# Patient Record
Sex: Male | Born: 1970 | Race: White | Hispanic: No | Marital: Single | Smoking: Never smoker
Health system: Southern US, Community
[De-identification: ages and names within clinical notes are randomized; demographics above are authoritative.]

---

## 2002-08-28 ENCOUNTER — Encounter: Payer: Self-pay | Admitting: *Deleted

## 2002-08-28 ENCOUNTER — Encounter: Admission: RE | Admit: 2002-08-28 | Discharge: 2002-08-28 | Payer: Self-pay | Admitting: *Deleted

## 2002-09-04 ENCOUNTER — Ambulatory Visit (HOSPITAL_BASED_OUTPATIENT_CLINIC_OR_DEPARTMENT_OTHER): Admission: RE | Admit: 2002-09-04 | Discharge: 2002-09-04 | Payer: Self-pay | Admitting: Urology

## 2003-01-14 ENCOUNTER — Encounter (HOSPITAL_COMMUNITY): Admission: RE | Admit: 2003-01-14 | Discharge: 2003-04-14 | Payer: Self-pay | Admitting: *Deleted

## 2005-03-13 IMAGING — NM NM THYROID IMAGING W/ UPTAKE SINGLE (24 HR)
4 series · 4 of 4 positions shown · non-contrast
Comparison: none

CLINICAL DATA: Thyroid enlargement.  
 NUCLEAR MEDICINE THYROID IMAGING WITH UPTAKE 
 Following oral ingestion of 13.6 uCi of iodine-222, a 24-hour radioactive iodine uptake of 12.3% was obtained (normal 10-35%).    Following the IV injection of 10.0 mCi of pertechnetate, images were obtained which showed an inhomogeneous gland with an appearance most consistent with a multinodular gland.  When correlating the appearance with the report from [REDACTED] on 08/28/02, and from the patient?s thyroid ultrasound, the findings would correspond to the appearance on the ultrasound study.

[Series 1: th thyroid scan · 1.07mm/px · 1 of 1 slices shown (1 of 4)]
[im 1/1  full-range]
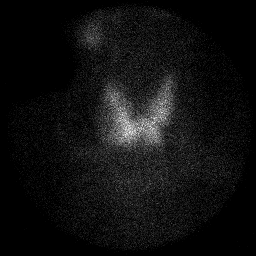

[Series 1: th thyroid scan · 1.07mm/px · 1 of 1 slices shown (2 of 4)]
[im 1/1  full-range]
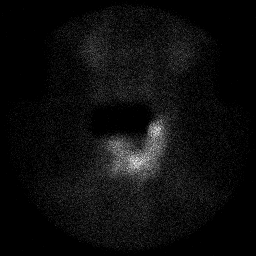

[Series 1: th thyroid scan · 1.07mm/px · 1 of 1 slices shown (3 of 4)]
[im 1/1  full-range]
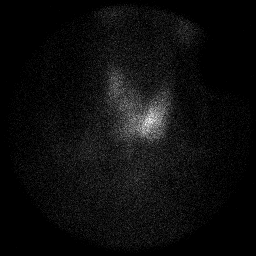

[Series 1: th thyroid scan · 1.07mm/px · 1 of 1 slices shown (4 of 4)]
[im 1/1  full-range]
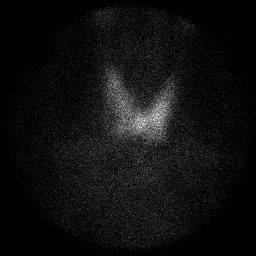

[4 of 4 positions shown; findings below may reference images not displayed]

IMPRESSION: Low-normal 24-hour radioactive iodine uptake at 12.3%.  The findings are most consistent with a multinodular goiter, as discussed above.

## 2010-01-31 ENCOUNTER — Encounter: Payer: Self-pay | Admitting: *Deleted

## 2021-10-11 ENCOUNTER — Other Ambulatory Visit: Payer: Self-pay

## 2021-10-11 ENCOUNTER — Emergency Department (HOSPITAL_COMMUNITY)
Admission: EM | Admit: 2021-10-11 | Discharge: 2021-10-12 | Disposition: A | Discharge status: Home / Self Care | Payer: Medicaid - Out of State | Attending: Emergency Medicine | Admitting: Emergency Medicine

## 2021-10-11 DIAGNOSIS — M795 Residual foreign body in soft tissue: Secondary | ICD-10-CM | POA: Diagnosis present

## 2021-10-11 MED ORDER — LIDOCAINE HCL (PF) 1 % IJ SOLN
5.0000 mL | Freq: Once | INTRAMUSCULAR | Status: AC
  Administered 2021-10-12: 5 mL
  Filled 2021-10-11: qty 30

## 2021-10-11 NOTE — ED Provider Notes (Signed)
[facility] DEPT Provider Note   CSN: [id number] Arrival date & time: 10/11/21  2021     History  Chief Complaint  Patient presents with   Foreign Body in Anthony Potts is a 51 y.o. male who presents to the emergency department with concerns for foreign body in skin onset today.  Patient notes that he was running today when he ran into a branch.  He has that he presents to be a splinter in his left shin.  Denies trying to remove at home.  Patient notes that he used alcohol as well as wet wipes to clean the area.  Denies fever, redness, swelling, drainage.  Denies allergies to medications.  The history is provided by the patient. No language interpreter was used.       Home Medications Prior to Admission medications   Not on File      Allergies    Patient has no allergy information on record.    Review of Systems   Review of Systems  Constitutional:  Negative for fever.  Skin:  Negative for color change and wound.  All other systems reviewed and are negative.   Physical Exam Updated Vital Signs BP (!) 139/97   Pulse 65   Temp 98.4 F (36.9 C) (Oral)   Resp 18   Ht 5\' 10"  (1.778 m)   Wt 127 kg   SpO2 94%   BMI 40.18 kg/m  Physical Exam Vitals and nursing note reviewed.  Constitutional:      General: He is not in acute distress.    Appearance: Normal appearance.  Eyes:     General: No scleral icterus.    Extraocular Movements: Extraocular movements intact.  Cardiovascular:     Rate and Rhythm: Normal rate.  Pulmonary:     Effort: Pulmonary effort is normal. No respiratory distress.  Abdominal:     Palpations: Abdomen is soft. There is no mass.     Tenderness: There is no abdominal tenderness.  Musculoskeletal:        General: Normal range of motion.     Cervical back: Neck supple.     Comments: End piece of splinter noted laterally and distal to the left knee.  Mild TTP noted to the left knee. No TTP noted to  left shin. No drainage noted. No erythema noted.  Decreased flexion of left knee secondary to pain.  Normal extension of left knee.  Skin:    General: Skin is warm and dry.     Findings: No rash.  Neurological:     Mental Status: He is alert.     Sensory: Sensation is intact.     Motor: Motor function is intact.  Psychiatric:        Behavior: Behavior normal.     ED Results / Procedures / Treatments   Labs (all labs ordered are listed, but only abnormal results are displayed) Labs Reviewed - No data to display  EKG None  Radiology No results found.  Procedures Ultrasound ED Soft Tissue  Date/Time: 10/11/2021 10:49 PM  Performed by: Steva Colder A, PA-C Authorized by: Nehemiah Settle, PA-C   Procedure details:    Indications: evaluate for foreign body     Transverse view:  Visualized   Longitudinal view:  Visualized   Images: not archived   Location:    Location: lower extremity     Side:  Left Findings:     foreign body present .Foreign Body Removal  Date/Time: 10/12/2021 10:50 AM  Performed by: Karenann Cai, PA-C Authorized by: Karenann Cai, PA-C  Consent: Verbal consent obtained. Risks and benefits: risks, benefits and alternatives were discussed Consent given by: patient Patient understanding: patient states understanding of the procedure being performed Patient identity confirmed: verbally with patient and hospital-assigned identification number Time out: Immediately prior to procedure a "time out" was called to verify the correct patient, procedure, equipment, support staff and site/side marked as required. Body area: skin General location: lower extremity Location details: left knee Anesthesia: local infiltration  Anesthesia: Local Anesthetic: lidocaine 1% without epinephrine Anesthetic total: 5 mL  Sedation: Patient sedated: no  Complexity: simple 1 objects recovered. Objects recovered: wood Post-procedure assessment: foreign body  removed Patient tolerance: patient tolerated the procedure well with no immediate complications      Medications Ordered in ED Medications - No data to display  ED Course/ Medical Decision Making/ A&P                           Medical Decision Making Risk Prescription drug management.   Pt presents with concerns for foreign body to the left knee onset prior to arrival. Vital signs, pt afebrile. On exam, pt with End piece of splinter noted laterally and distal to the left knee. No TTP noted to the left knee. No TTP noted to left shin. No drainage noted. No erythema noted.   Medications:  I ordered medication including Keflex for first dose of antibiotic prophylaxis I have reviewed the patients home medicines and have made adjustments as needed   Disposition: Patient states suspicious for foreign body in the left knee.  Doubt cellulitis or abscess at this time. After consideration of the diagnostic results and the patients response to treatment, I feel that the patient would benefit from Discharge home.  We will treat patient with first dose of Keflex here in the emergency department.  Prescription sent for Keflex. Attending evaluated patient and removed 1 piece of wood, see his procedure note for additional details. Supportive care measures and strict return precautions discussed with patient at bedside. Pt acknowledges and verbalizes understanding. Pt appears safe for discharge. Follow up as indicated in discharge paperwork.    This chart was dictated using voice recognition software, Dragon. Despite the best efforts of this provider to proofread and correct errors, errors may still occur which can change documentation meaning.  Final Clinical Impression(s) / ED Diagnoses Final diagnoses:  Foreign body (FB) in soft tissue    Rx / DC Orders ED Discharge Orders          Ordered    cephALEXin (KEFLEX) 500 MG capsule  4 times daily        10/12/21 0149              Isel Skufca,  Woods Gangemi A, PA-C 10/13/21 0349    Glynn Octave, MD 10/13/21 [phone]

## 2021-10-11 NOTE — Discharge Instructions (Addendum)
It was a pleasure taking care of you today!  You will be sent a prescription for keflex, take as prescribed. Ensure to keep the area clean and dry. You may clean the area with soap and water if it becomes dirty. Follow up with your primary care provider as needed. Return to the ED if you are experiencing increasing/worsening symptoms such as drainage, inability to move the knee, redness spreading, or fever.

## 2021-10-11 NOTE — ED Triage Notes (Signed)
Pt states that he was running today and fell, getting some wood stuck under his skin (left knee). Pt is ambulatory.

## 2021-10-11 NOTE — ED Provider Triage Note (Signed)
Emergency Medicine Provider Triage Evaluation Note  Anthony Potts , a 51 y.o. male  was evaluated in triage.  Pt complains of foreign body in the left leg. Was running and ran into a branch. Complaining of what he thinks is a 2 inch splinter in his left shin.  Review of Systems  Positive: Foreign body in leg Negative:   Physical Exam  BP (!) 181/95 (BP Location: Left Arm)   Pulse 66   Temp 98.4 F (36.9 C) (Oral)   Resp 18   Ht 5\' 10"  (1.778 m)   Wt 127 kg   SpO2 100%   BMI 40.18 kg/m  Gen:   Awake, no distress   Resp:  Normal effort  MSK:   Moves extremities without difficulty  Other:  Can visualize end piece just lateral/distal to the inner left knee  Medical Decision Making  Medically screening exam initiated at 8:41 PM.  Appropriate orders placed.  Anthony Potts was informed that the remainder of the evaluation will be completed by another provider, this initial triage assessment does not replace that evaluation, and the importance of remaining in the ED until their evaluation is complete.     Kateri Plummer, Hershal Coria 10/11/21 2042

## 2021-10-12 MED ORDER — BACITRACIN ZINC 500 UNIT/GM EX OINT
TOPICAL_OINTMENT | Freq: Once | CUTANEOUS | Status: DC
  Filled 2021-10-12: qty 0.9

## 2021-10-12 MED ORDER — CEPHALEXIN 500 MG PO CAPS: 500.0000 mg | ORAL_CAPSULE | Freq: Four times a day (QID) | ORAL | 0 refills | Status: AC

## 2021-10-12 MED ORDER — CEPHALEXIN 500 MG PO CAPS
500.0000 mg | ORAL_CAPSULE | Freq: Once | ORAL | Status: AC
  Administered 2021-10-12: 500 mg via ORAL
  Filled 2021-10-12: qty 1

## 2022-12-03 ENCOUNTER — Ambulatory Visit: Payer: BC Managed Care – PPO | Admitting: Family Medicine

## 2022-12-03 ENCOUNTER — Encounter: Payer: Self-pay | Admitting: Family Medicine

## 2022-12-03 VITALS — BP 152/86 | HR 90 | Temp 97.6°F | Ht 70.5 in | Wt 311.6 lb

## 2022-12-03 DIAGNOSIS — E66813 Obesity, class 3: Secondary | ICD-10-CM

## 2022-12-03 DIAGNOSIS — R030 Elevated blood-pressure reading, without diagnosis of hypertension: Secondary | ICD-10-CM

## 2022-12-03 DIAGNOSIS — G8929 Other chronic pain: Secondary | ICD-10-CM | POA: Insufficient documentation

## 2022-12-03 DIAGNOSIS — M25562 Pain in left knee: Secondary | ICD-10-CM | POA: Diagnosis not present

## 2022-12-03 DIAGNOSIS — K760 Fatty (change of) liver, not elsewhere classified: Secondary | ICD-10-CM

## 2022-12-03 DIAGNOSIS — Z6841 Body Mass Index (BMI) 40.0 and over, adult: Secondary | ICD-10-CM

## 2022-12-03 DIAGNOSIS — E785 Hyperlipidemia, unspecified: Secondary | ICD-10-CM | POA: Insufficient documentation

## 2022-12-03 DIAGNOSIS — E039 Hypothyroidism, unspecified: Secondary | ICD-10-CM | POA: Insufficient documentation

## 2022-12-03 DIAGNOSIS — R140 Abdominal distension (gaseous): Secondary | ICD-10-CM | POA: Diagnosis not present

## 2022-12-03 NOTE — Progress Notes (Incomplete)
Assessment  Assessment/Plan:   Problem List Items Addressed This Visit   None   There are no discontinued medications.  Patient Counseling(The following topics were reviewed and/or handout was given):  -Nutrition: Stressed importance of moderation in sodium/caffeine intake, saturated fat and cholesterol, caloric balance, sufficient intake of fresh fruits, vegetables, and fiber.  -Stressed the importance of regular exercise.   -Substance Abuse: Discussed cessation/primary prevention of tobacco, alcohol, or other drug use; driving or other dangerous activities under the influence; availability of treatment for abuse.   -Injury prevention: Discussed safety belts, safety helmets, smoke detector, smoking near bedding or upholstery.   -Sexuality: Discussed sexually transmitted diseases, partner selection, use of condoms, avoidance of unintended pregnancy and contraceptive alternatives.   -Dental health: Discussed importance of regular tooth brushing, flossing, and dental visits.  -Health maintenance and immunizations reviewed. Please refer to Health maintenance section.  Return to care in 1 year for next preventative visit.        Subjective:  Chief complaint Encounter date: 12/03/2022  Chief Complaint  Patient presents with  . Establish Care    Wants CPE if possible. Referral for chest hernia and torn MCL or acl left knee. Declined colonoscopy referral , but would like to discuss for future visit.   Anthony Potts is a 52 y.o. male who presents today for his annual comprehensive physical exam.     Lifestyle Diet: *** Exercise: ***    ROS     12/03/2022    3:54 PM  Depression screen PHQ 2/9  Decreased Interest 0  Down, Depressed, Hopeless 0  PHQ - 2 Score 0  Altered sleeping 0  Tired, decreased energy 0  Change in appetite 0  Feeling bad or failure about yourself  0  Trouble concentrating 0  Moving slowly or fidgety/restless 0  Suicidal thoughts 0  PHQ-9 Score  0  Difficult doing work/chores Not difficult at all       12/03/2022    3:54 PM  GAD 7 : Generalized Anxiety Score  Nervous, Anxious, on Edge 0  Control/stop worrying 0  Worry too much - different things 0  Trouble relaxing 0  Restless 0  Easily annoyed or irritable 0  Afraid - awful might happen 0  Total GAD 7 Score 0  Anxiety Difficulty Not difficult at all    Health Maintenance Due  Topic Date Due  . HIV Screening  Never done  . Hepatitis C Screening  Never done  . Colonoscopy  Never done     Dental: ***UTD Vision: ***UTD  PMH:  The following were reviewed and entered/updated in epic: History reviewed. No pertinent past medical history.  There are no problems to display for this patient.   History reviewed. No pertinent surgical history.  History reviewed. No pertinent family history.  Medications- reviewed and updated No outpatient medications prior to visit.   No facility-administered medications prior to visit.    No Known Allergies  Social History   Socioeconomic History  . Marital status: Single    Spouse name: Not on file  . Number of children: Not on file  . Years of education: Not on file  . Highest education level: Not on file  Occupational History  . Not on file  Tobacco Use  . Smoking status: Never    Passive exposure: Never  . Smokeless tobacco: Never  Vaping Use  . Vaping status: Never Used  Substance and Sexual Activity  . Alcohol use: Yes    Alcohol/week:  1.0 standard drink of alcohol    Types: 1 Cans of beer per week  . Drug use: Never  . Sexual activity: Not on file  Other Topics Concern  . Not on file  Social History Narrative  . Not on file   Social Determinants of Health   Financial Resource Strain: Not on file  Food Insecurity: Not on file  Transportation Needs: Not on file  Physical Activity: Not on file  Stress: Not on file (11/15/2022)  Social Connections: Not on file           Objective:  Physical  Exam: BP (!) 152/86 (BP Location: Left Arm, Patient Position: Sitting, Cuff Size: Large)   Pulse 90   Temp 97.6 F (36.4 C) (Temporal)   Ht 5' 10.5" (1.791 m)   Wt (!) 311 lb 9.6 oz (141.3 kg)   SpO2 99%   BMI 44.08 kg/m   Body mass index is 44.08 kg/m. Wt Readings from Last 3 Encounters:  12/03/22 (!) 311 lb 9.6 oz (141.3 kg)  10/11/21 280 lb (127 kg)    Physical Exam      At today's visit, we discussed treatment options, associated risk and benefits, and engage in counseling as needed.  Additionally the following were reviewed: Past medical records, past medical and surgical history, family and social background, as well as relevant laboratory results, imaging findings, and specialty notes, where applicable.  This message was generated using dictation software, and as a result, it may contain unintentional typos or errors.  Nevertheless, extensive effort was made to accurately convey at the pertinent aspects of the patient visit.    There may have been are other unrelated non-urgent complaints, but due to the busy schedule and the amount of time already spent with him, time does not permit to address these issues at today's visit. Another appointment may have or has been requested to review these additional issues.     Thomes Dinning, MD, MS

## 2022-12-04 NOTE — Assessment & Plan Note (Signed)
Discomfort since hyperextension injury 2 years ago.  Plan: Refer to orthopedics. Continue conservative measures. Return if symptoms worsen.

## 2022-12-04 NOTE — Assessment & Plan Note (Signed)
Off levothyroxine 112 mcg daily for years. Plan: At next visit Order TSH and Free T4 tests. Restart medication based on labs.

## 2022-12-04 NOTE — Progress Notes (Signed)
Assessment/Plan:   Problem List Items Addressed This Visit       Digestive   Metabolic dysfunction-associated steatotic liver disease (MASLD)    History of elevated AST/ALT (AST 52 U/L, ALT 84 U/L in 2011).  Plan: At next visit order liver function tests and abdominal ultrasound Promote weight loss and diet changes. Follow-Up: Review labs at next appointment; periodic monitoring.      Relevant Orders   US Abdomen Complete     Endocrine   Acquired hypothyroidism    Off levothyroxine 112 mcg daily for years. Plan: At next visit Order TSH and Free T4 tests. Restart medication based on labs.        Other   Hyperlipidemia    History of elevated LDL (113 mg/dL in 9323). Plan: Order lipid panel at next follow up Treat pending results      Elevated blood pressure reading in office without diagnosis of hypertension    BP 152/86 mmHg; no prior hypertension.  Plan: Advise home BP monitoring. Encourage reduced sodium intake and exercise. Follow-Up: Re-evaluate BP in 2 weeks; educate on warning signs.      Class 3 severe obesity due to excess calories with body mass index (BMI) of 40.0 to 44.9 in adult [facility])    Counsel on diet and exercise. Consider Referral to nutritionist. Follow-Up: Reassess in 2 weeks; monitor weight and BMI.      Chronic pain of left knee    Discomfort since hyperextension injury 2 years ago.  Plan: Refer to orthopedics. Continue conservative measures. Return if symptoms worsen.      Relevant Orders   Ambulatory referral to Orthopedic Surgery   Abdominal distention - Primary   Relevant Orders   US Abdomen Complete    There are no discontinued medications.  Return for physical (fasting labs).    Subjective:   Encounter date: 12/03/2022  Anthony Potts is a 52 y.o. male who has Acquired hypothyroidism; Hyperlipidemia; Elevated blood pressure reading in office without diagnosis of hypertension; Class 3 severe obesity due to excess  calories with body mass index (BMI) of 40.0 to 44.9 in adult [facility]); Chronic pain of left knee; Abdominal distention; and Metabolic dysfunction-associated steatotic liver disease (MASLD) on their problem list..   He  has no past medical history on file..   Chief Complaint: Establishing care; evaluation of chronic conditions.  History of Present Illness:  52 year old patient returns to care after years without medical supervision due to lack of insurance. Reports obesity (BMI 44), elevated BP today (152/86 mmHg), past hyperlipidemia (LDL 113 mg/dL in 5573), hypothyroidism (stopped levothyroxine 112 mcg daily years ago), and NAFLD (elevated AST/ALT in 2011).  Expresses concern about a reducible, non-tender epigastric bulge noticeable when bending or straining.   Reports left knee discomfort since a hyperextension injury during a run two years prior; manages pain conservatively.  Denies associated symptoms such as chest pain, dyspnea, gastrointestinal bleeding, neurological deficits, or signs of infection.  Review of Systems:  Positive: Epigastric bulge, left knee discomfort. Negative: Remainder of systems reviewed and non-contributory.    History reviewed. No pertinent surgical history.  No outpatient medications prior to visit.   No facility-administered medications prior to visit.    History reviewed. No pertinent family history.  Social History   Socioeconomic History   Marital status: Single    Spouse name: Not on file   Number of children: Not on file   Years of education: Not on file   Highest education level: Not on  file  Occupational History   Not on file  Tobacco Use   Smoking status: Never    Passive exposure: Never   Smokeless tobacco: Never  Vaping Use   Vaping status: Never Used  Substance and Sexual Activity   Alcohol use: Yes    Alcohol/week: 1.0 standard drink of alcohol    Types: 1 Cans of beer per week   Drug use: Never   Sexual activity: Not on  file  Other Topics Concern   Not on file  Social History Narrative   Not on file   Social Determinants of Health   Financial Resource Strain: Not on file  Food Insecurity: Not on file  Transportation Needs: Not on file  Physical Activity: Not on file  Stress: Not on file (11/15/2022)  Social Connections: Not on file  Intimate Partner Violence: Not on file                                                                                                  Objective:  Physical Exam: BP (!) 152/86 (BP Location: Left Arm, Patient Position: Sitting, Cuff Size: Large)   Pulse 90   Temp 97.6 F (36.4 C) (Temporal)   Ht 5' 10.5" (1.791 m)   Wt (!) 311 lb 9.6 oz (141.3 kg)   SpO2 99%   BMI 44.08 kg/m     Physical Exam Constitutional:      Appearance: Normal appearance.  HENT:     Head: Normocephalic and atraumatic.     Right Ear: Hearing normal.     Left Ear: Hearing normal.     Nose: Nose normal.  Eyes:     General: No scleral icterus.       Right eye: No discharge.        Left eye: No discharge.     Extraocular Movements: Extraocular movements intact.  Cardiovascular:     Rate and Rhythm: Normal rate and regular rhythm.     Heart sounds: Normal heart sounds.  Pulmonary:     Effort: Pulmonary effort is normal.     Breath sounds: Normal breath sounds.  Abdominal:     General: Abdomen is flat. Bowel sounds are normal. There is distension (easily reducable).     Palpations: Abdomen is soft. There is no mass.     Tenderness: There is no abdominal tenderness.    Musculoskeletal:     Right knee: Normal.     Left knee: No swelling, deformity, effusion or erythema. Normal range of motion. No tenderness. Normal alignment, normal meniscus and normal patellar mobility.  Skin:    General: Skin is warm.     Findings: No rash.  Neurological:     General: No focal deficit present.     Mental Status: He is alert.     Cranial Nerves: No cranial nerve deficit.  Psychiatric:         Mood and Affect: Mood normal.        Behavior: Behavior normal.        Thought Content: Thought content normal.        Judgment:  Judgment normal.     No results found.  No results found for this or any previous visit (from the past 2160 hour(s)).      Garner Nash, MD, MS

## 2022-12-04 NOTE — Assessment & Plan Note (Signed)
History of elevated LDL (113 mg/dL in 1610). Plan: Order lipid panel at next follow up Treat pending results

## 2022-12-04 NOTE — Assessment & Plan Note (Signed)
BP 152/86 mmHg; no prior hypertension.  Plan: Advise home BP monitoring. Encourage reduced sodium intake and exercise. Follow-Up: Re-evaluate BP in 2 weeks; educate on warning signs.

## 2022-12-04 NOTE — Assessment & Plan Note (Signed)
History of elevated AST/ALT (AST 52 U/L, ALT 84 U/L in 2011).  Plan: At next visit order liver function tests and abdominal ultrasound Promote weight loss and diet changes. Follow-Up: Review labs at next appointment; periodic monitoring.

## 2022-12-04 NOTE — Assessment & Plan Note (Signed)
Counsel on diet and exercise. Consider Referral to nutritionist. Follow-Up: Reassess in 2 weeks; monitor weight and BMI.

## 2022-12-06 ENCOUNTER — Telehealth: Payer: Self-pay | Admitting: Family Medicine

## 2022-12-06 NOTE — Telephone Encounter (Signed)
Left patient a detailed voice message with Ortho care info and advised patient that their information was sent this morning by Verdis Frederickson via [place].    [id number] - AMB REFERRAL TO ORTHOPEDIC SURGERY Authorization #:     Referring Provider Information Garnette Gunner [facility] [phone] Referring To Provider Information [facility] [address] [place] [address] [phone]   Referral Start Date: 12/04/2022 Referral End Date: 12/04/2023

## 2022-12-06 NOTE — Telephone Encounter (Signed)
Please give patient a call. Patient really want to know when and where do he go to the ortho dr. I really think he trying to get in there early like asap

## 2022-12-13 ENCOUNTER — Ambulatory Visit (HOSPITAL_BASED_OUTPATIENT_CLINIC_OR_DEPARTMENT_OTHER)
Admission: RE | Admit: 2022-12-13 | Discharge: 2022-12-13 | Disposition: A | Discharge status: Home / Self Care | Payer: BC Managed Care – PPO | Source: Ambulatory Visit | Attending: Family Medicine | Admitting: Family Medicine

## 2022-12-13 DIAGNOSIS — R140 Abdominal distension (gaseous): Secondary | ICD-10-CM | POA: Diagnosis not present

## 2022-12-13 DIAGNOSIS — K760 Fatty (change of) liver, not elsewhere classified: Secondary | ICD-10-CM | POA: Diagnosis not present

## 2022-12-13 DIAGNOSIS — R939 Diagnostic imaging inconclusive due to excess body fat of patient: Secondary | ICD-10-CM | POA: Diagnosis not present

## 2022-12-13 NOTE — Addendum Note (Signed)
Addended by: Fanny Bien B on: 12/13/2022 12:48 PM   Modules accepted: Orders

## 2022-12-14 ENCOUNTER — Encounter: Payer: Self-pay | Admitting: Family Medicine

## 2022-12-14 ENCOUNTER — Other Ambulatory Visit (INDEPENDENT_AMBULATORY_CARE_PROVIDER_SITE_OTHER): Payer: Self-pay

## 2022-12-14 ENCOUNTER — Ambulatory Visit: Payer: BC Managed Care – PPO | Admitting: Physician Assistant

## 2022-12-14 ENCOUNTER — Encounter: Payer: Self-pay | Admitting: Physician Assistant

## 2022-12-14 ENCOUNTER — Ambulatory Visit (INDEPENDENT_AMBULATORY_CARE_PROVIDER_SITE_OTHER): Payer: BC Managed Care – PPO | Admitting: Family Medicine

## 2022-12-14 VITALS — BP 140/80 | HR 76 | Temp 97.6°F | Ht 70.5 in | Wt 308.4 lb

## 2022-12-14 DIAGNOSIS — G8929 Other chronic pain: Secondary | ICD-10-CM

## 2022-12-14 DIAGNOSIS — Z125 Encounter for screening for malignant neoplasm of prostate: Secondary | ICD-10-CM

## 2022-12-14 DIAGNOSIS — R140 Abdominal distension (gaseous): Secondary | ICD-10-CM

## 2022-12-14 DIAGNOSIS — I10 Essential (primary) hypertension: Secondary | ICD-10-CM

## 2022-12-14 DIAGNOSIS — Z1211 Encounter for screening for malignant neoplasm of colon: Secondary | ICD-10-CM

## 2022-12-14 DIAGNOSIS — R0683 Snoring: Secondary | ICD-10-CM | POA: Insufficient documentation

## 2022-12-14 DIAGNOSIS — M25562 Pain in left knee: Secondary | ICD-10-CM

## 2022-12-14 DIAGNOSIS — K760 Fatty (change of) liver, not elsewhere classified: Secondary | ICD-10-CM | POA: Diagnosis not present

## 2022-12-14 DIAGNOSIS — Z0000 Encounter for general adult medical examination without abnormal findings: Secondary | ICD-10-CM

## 2022-12-14 DIAGNOSIS — R351 Nocturia: Secondary | ICD-10-CM | POA: Diagnosis not present

## 2022-12-14 DIAGNOSIS — E039 Hypothyroidism, unspecified: Secondary | ICD-10-CM | POA: Diagnosis not present

## 2022-12-14 DIAGNOSIS — E66813 Obesity, class 3: Secondary | ICD-10-CM

## 2022-12-14 DIAGNOSIS — Z6841 Body Mass Index (BMI) 40.0 and over, adult: Secondary | ICD-10-CM

## 2022-12-14 DIAGNOSIS — E782 Mixed hyperlipidemia: Secondary | ICD-10-CM

## 2022-12-14 HISTORY — DX: Essential (primary) hypertension: I10

## 2022-12-14 LAB — CBC WITH DIFFERENTIAL/PLATELET
Basophils Absolute: 0.1 10*3/uL (ref 0.0–0.1)
Basophils Relative: 1.1 % (ref 0.0–3.0)
Eosinophils Absolute: 0.2 10*3/uL (ref 0.0–0.7)
Eosinophils Relative: 3.5 % (ref 0.0–5.0)
HCT: 49.4 % (ref 39.0–52.0)
Hemoglobin: 16.7 g/dL (ref 13.0–17.0)
Lymphocytes Relative: 40.4 % (ref 12.0–46.0)
Lymphs Abs: 2.4 10*3/uL (ref 0.7–4.0)
MCHC: 33.7 g/dL (ref 30.0–36.0)
MCV: 94.2 fL (ref 78.0–100.0)
Monocytes Absolute: 0.5 10*3/uL (ref 0.1–1.0)
Monocytes Relative: 8.7 % (ref 3.0–12.0)
Neutro Abs: 2.7 10*3/uL (ref 1.4–7.7)
Neutrophils Relative %: 46.3 % (ref 43.0–77.0)
Platelets: 185 10*3/uL (ref 150.0–400.0)
RBC: 5.24 Mil/uL (ref 4.22–5.81)
RDW: 13.8 % (ref 11.5–15.5)
WBC: 5.9 10*3/uL (ref 4.0–10.5)

## 2022-12-14 LAB — COMPREHENSIVE METABOLIC PANEL
ALT: 47 U/L (ref 0–53)
AST: 35 U/L (ref 0–37)
Albumin: 4 g/dL (ref 3.5–5.2)
Alkaline Phosphatase: 61 U/L (ref 39–117)
BUN: 17 mg/dL (ref 6–23)
CO2: 30 meq/L (ref 19–32)
Calcium: 8.9 mg/dL (ref 8.4–10.5)
Chloride: 106 meq/L (ref 96–112)
Creatinine, Ser: 0.81 mg/dL (ref 0.40–1.50)
GFR: 103.02 mL/min (ref >60.00)
Glucose, Bld: 97 mg/dL (ref 70–99)
Potassium: 4.4 meq/L (ref 3.5–5.1)
Sodium: 140 meq/L (ref 135–145)
Total Bilirubin: 0.5 mg/dL (ref 0.2–1.2)
Total Protein: 6.9 g/dL (ref 6.0–8.3)

## 2022-12-14 LAB — LIPID PANEL
Cholesterol: 195 mg/dL (ref 0–200)
HDL: 36.9 mg/dL — ABNORMAL LOW (ref >39.00)
LDL Cholesterol: 133 mg/dL — ABNORMAL HIGH (ref 0–99)
NonHDL: 157.74
Total CHOL/HDL Ratio: 5
Triglycerides: 125 mg/dL (ref 0.0–149.0)
VLDL: 25 mg/dL (ref 0.0–40.0)

## 2022-12-14 LAB — TSH: TSH: 11.17 u[IU]/mL — ABNORMAL HIGH (ref 0.35–5.50)

## 2022-12-14 LAB — HEMOGLOBIN A1C: Hgb A1c MFr Bld: 5.9 % (ref 4.6–6.5)

## 2022-12-14 LAB — PSA: PSA: 1.39 ng/mL (ref 0.10–4.00)

## 2022-12-14 MED ORDER — BLOOD PRESSURE CUFF MISC: 1.0000 | 0 refills | Status: DC | PRN

## 2022-12-14 NOTE — Progress Notes (Signed)
Office Visit Note   Patient: Anthony Potts           Date of Birth: 1970-06-20           MRN: [id number] Visit Date: 12/14/2022              Requested by: Garnette Gunner, MD [address] [place],  [place] [address] PCP: Garnette Gunner, MD   Assessment & Plan: Visit Diagnoses: Medial meniscus tear left knee  Plan: Patient is an avid distance runner.  He comes in today after hyperextending his left knee 6 months ago on a race.  He has been running and competing since he was in high school.  Following this he did modify his activity did not run for a month.  Pain continues to be consistent despite activity modification.  He does have some instability occasionally when he turns a certain way.  Never had problem with the knee before.  It has been now 6 months with activity modification.  His ligamentous structures appear to be solid however I am concerned for medial meniscus tear.  He has had a regular x-ray today that does not demonstrate any osseous reasons for his pain.  Recommended an MRI based on that we will decide follow-up  Follow-Up Instructions: No follow-ups on file.   Orders:  No orders of the defined types were placed in this encounter.  No orders of the defined types were placed in this encounter.     Procedures: No procedures performed   Clinical Data: No additional findings.   Subjective: Chief Complaint  Patient presents with   Left Knee - Pain    HPI patient is a pleasant 52 year old gentleman with a 5-month history of left medial knee pain.  This occurred after a hyperextension injury when he was running down a hill on a 24-hour race in May.  He cannot run now secondary to the pain no swelling or popping he does get instability has a history of an foreign object impaled into this knee that was removed in the ER in the past rates his pain is moderate and limiting  Review of Systems  All other systems reviewed and are  negative.    Objective: Vital Signs: There were no vitals taken for this visit.  Physical Exam Constitutional:      Appearance: Normal appearance.  Pulmonary:     Effort: Pulmonary effort is normal.  Skin:    General: Skin is warm and dry.  Neurological:     General: No focal deficit present.     Mental Status: He is alert.  Psychiatric:        Mood and Affect: Mood normal.        Behavior: Behavior normal.     Ortho Exam Left knee no effusion no erythema compartments are soft and compressible he has excellent strength good quadricep patellar strength.  He has good endpoint on anterior draw good varus valgus stability.  He is acutely tender over the medial posterior joint line tender.  Nontender over the lateral joint line Specialty Comments:  No specialty comments available.  Imaging: US Abdomen Complete  Result Date: 12/13/2022 CLINICAL DATA:  Abdominal distention EXAM: ABDOMEN ULTRASOUND COMPLETE COMPARISON:  None Available. FINDINGS: Gallbladder: No gallstones or wall thickening visualized. No sonographic Murphy sign noted by sonographer. Common bile duct: Diameter: 3 mm Liver: Increased echogenicity. No focal lesion. Portal vein is patent on color Doppler imaging with normal direction of blood flow towards the liver.  IVC: No abnormality visualized. Pancreas: Visualized portion unremarkable. Spleen: Size and appearance within normal limits. Right Kidney: Length: 13.6 cm. Echogenicity within normal limits. No mass or hydronephrosis visualized. Left Kidney: Length: 13.2 cm. Echogenicity within normal limits. No mass or hydronephrosis visualized. Abdominal aorta: No aneurysm visualized. Other findings: At the site of midline abdominal bulge no definite mass or hernia identified however examination is limited secondary to body habitus. IMPRESSION: 1. Increased hepatic parenchymal echogenicity suggestive of steatosis. 2. No cholelithiasis or sonographic evidence for acute cholecystitis.  3. At the site of midline abdominal bulge no definite mass or hernia identified however examination is limited secondary to body habitus. Electronically Signed   By: Annia Belt M.D.   On: 12/13/2022 09:48     PMFS History: Patient Active Problem List   Diagnosis Date Noted   Acquired hypothyroidism 12/03/2022   Hyperlipidemia 12/03/2022   Elevated blood pressure reading in office without diagnosis of hypertension 12/03/2022   Class 3 severe obesity due to excess calories with body mass index (BMI) of 40.0 to 44.9 in adult [facility]) 12/03/2022   Chronic pain of left knee 12/03/2022   Abdominal distention 12/03/2022   Metabolic dysfunction-associated steatotic liver disease (MASLD) 12/03/2022   History reviewed. No pertinent past medical history.  Family History  Problem Relation Age of Onset   Diabetes Mother    Cancer Sister     History reviewed. No pertinent surgical history. Social History   Occupational History   Not on file  Tobacco Use   Smoking status: Never    Passive exposure: Never   Smokeless tobacco: Never  Vaping Use   Vaping status: Never Used  Substance and Sexual Activity   Alcohol use: Not Currently    Alcohol/week: 1.0 standard drink of alcohol    Types: 1 Cans of beer per week   Drug use: Never   Sexual activity: Yes    Birth control/protection: Condom

## 2022-12-14 NOTE — Progress Notes (Signed)
Patient unable to leave urine with lab work

## 2022-12-14 NOTE — Assessment & Plan Note (Signed)
Recently seen on abdominal US. Current functional status unknown without recent labs.  Plan:  Order liver function tests to assess hepatic status. Advise on lifestyle modifications including weight loss, dietary changes, and limiting alcohol intake. Counsel on avoiding acetaminophen and other hepatotoxic substances.

## 2022-12-14 NOTE — Progress Notes (Signed)
Assessment  Assessment/Plan:   Problem List Items Addressed This Visit       Cardiovascular and Mediastinum   Primary hypertension    Newly diagnosed hypertension.  Plan:  Recommend obtaining a home blood pressure cuff (upper arm type) and monitoring blood pressure at home most days of the week. Provide information on proper blood pressure measurement techniques. Order baseline lab work, including metabolic panel, lipid panel, fasting glucose, HbA1c, urinalysis, and kidney function tests. Review home blood pressure readings at the next appointment in approximately one month. Compare patient's home readings with clinic measurements to assess consistency.      Relevant Medications   Blood Pressure Monitoring (BLOOD PRESSURE CUFF) MISC   Other Relevant Orders   Microalbumin / creatinine urine ratio     Digestive   Metabolic dysfunction-associated steatotic liver disease (MASLD)    Recently seen on abdominal US. Current functional status unknown without recent labs.  Plan:  Order liver function tests to assess hepatic status. Advise on lifestyle modifications including weight loss, dietary changes, and limiting alcohol intake. Counsel on avoiding acetaminophen and other hepatotoxic substances.      Relevant Orders   CBC with Differential/Platelet   Comprehensive metabolic panel     Endocrine   Acquired hypothyroidism    Unmanaged; current thyroid status unknown.  Plan:  Order TSH to assess current levels. Restart levothyroxine therapy based on lab results.      Relevant Orders   TSH     Other   Hyperlipidemia    History of elevated LDL (113 mg/dL in 1478). Plan: Order lipid panel       Relevant Orders   Lipid panel   Class 3 severe obesity due to excess calories with body mass index (BMI) of 40.0 to 44.9 in adult (HCC)    Elevated BMI 44 kg/m; contributes to elevated blood pressure, risk of sleep apnea, and fatty liver disease.  Plan:  Provide diet  and exercise recommendations via the patient portal. Encourage gradual weight loss through lifestyle changes. Discuss potential benefits of weight reduction on comorbid conditions.       Abdominal distention    Reducible, non-tender abdominal bulge; ultrasound unremarkable; CT scan scheduled for further evaluation.  Plan:  Proceed with scheduled abdominal CT scan to investigate the cause of the bulge.      Nocturia    Frequent nighttime urination; possible etiologies include benign prostatic hyperplasia (BPH) or diabetes mellitus.  Plan:  Include PSA testing to assess prostate health. Check fasting blood glucose and HbA1c to evaluate for diabetes. Urinalysis to rule out infection Monitor urinary symptoms. Consider urology referral if symptoms persist or worsen.       Relevant Orders   PSA   Hemoglobin A1c   Urinalysis w microscopic + reflex cultur   Snoring    Reports loud snoring and daytime fatigue; risk factors include obesity (BMI 44).  Plan:  Order a home sleep study to evaluate for obstructive sleep apnea. Educate on the health implications of untreated sleep apnea.      Relevant Orders   Ambulatory referral to Sleep Studies   Other Visit Diagnoses     Encounter for well adult exam without abnormal findings    -  Primary   Screening for malignant neoplasm of prostate       Relevant Orders   PSA   Screening for colon cancer       Relevant Orders   Cologuard       There are no  discontinued medications.  Patient Counseling(The following topics were reviewed and/or handout was given):  -Nutrition: Stressed importance of moderation in sodium/caffeine intake, saturated fat and cholesterol, caloric balance, sufficient intake of fresh fruits, vegetables, and fiber.  -Stressed the importance of regular exercise.   -Substance Abuse: Discussed cessation/primary prevention of tobacco, alcohol, or other drug use; driving or other dangerous activities under the  influence; availability of treatment for abuse.   -Injury prevention: Discussed safety belts, safety helmets, smoke detector, smoking near bedding or upholstery.   -Sexuality: Discussed sexually transmitted diseases, partner selection, use of condoms, avoidance of unintended pregnancy and contraceptive alternatives.   -Dental health: Discussed importance of regular tooth brushing, flossing, and dental visits.  -Health maintenance and immunizations reviewed. Please refer to Health maintenance section.  Return to care in 1 year for next preventative visit.        Subjective:  Chief complaint Encounter date: 12/14/2022  Chief Complaint  Patient presents with   Annual Exam    Fasting   Chief Complaint: Annual physical examination and follow-up on abdominal bulge.  History of Present Illness:  The patient is a 52 year old individual presenting for an annual physical examination and follow-up on a previously noted abdominal bulge. Reports that an ultrasound conducted did not reveal any abnormalities; a CT scan is scheduled for further evaluation.  Blood pressure readings have been elevated on two consecutive visits (142/84 mmHg today), with no prior diagnosis of hypertension and no current antihypertensive medications. The patient does not monitor blood pressure at home.  History of hypothyroidism, previously managed with levothyroxine (Synthroid), but medication was discontinued years ago. Also has a history of non-alcoholic fatty liver disease (now referred to as MASLD).  Reports frequent nighttime urination, waking multiple times per night, which varies based on evening fluid intake. Denies daytime urinary symptoms. Concerned about potential prostate issues due to age and nocturia.  Experiences loud snoring but has never been assessed for sleep apnea. Expresses interest in pursuing a home sleep study.  Denies chest pain, shortness of breath, abdominal pain, nausea, vomiting, or blood  in the stool.  Review of Systems:  Constitutional: No fever or weight loss. Eyes: No recent visual changes; last eye exam in February. ENT: Reports loud snoring. Cardiovascular: Denies chest pain, palpitations. Respiratory: Denies shortness of breath. Gastrointestinal: Notes abdominal bulge; denies pain, nausea, vomiting, hematochezia. Genitourinary: Reports nocturia; denies dysuria, hematuria. Neurological: No headaches or dizziness. Psychiatric: No mood changes or anxiety. Musculoskeletal: Denies joint pain or swelling. Skin: No rashes or lesions. Endocrine: History of hypothyroidism. Sleep: Reports loud snoring. Remainder of ROS is negative.   Health Maintenance:  Dental: Up to date; recent cleaning two weeks ago; additional appointments scheduled this week. Vision: Up to date; eye exam completed in February.      12/03/2022    3:54 PM  Depression screen PHQ 2/9  Decreased Interest 0  Down, Depressed, Hopeless 0  PHQ - 2 Score 0  Altered sleeping 0  Tired, decreased energy 0  Change in appetite 0  Feeling bad or failure about yourself  0  Trouble concentrating 0  Moving slowly or fidgety/restless 0  Suicidal thoughts 0  PHQ-9 Score 0  Difficult doing work/chores Not difficult at all       12/03/2022    3:54 PM  GAD 7 : Generalized Anxiety Score  Nervous, Anxious, on Edge 0  Control/stop worrying 0  Worry too much - different things 0  Trouble relaxing 0  Restless 0  Easily annoyed or  irritable 0  Afraid - awful might happen 0  Total GAD 7 Score 0  Anxiety Difficulty Not difficult at all    Health Maintenance Due  Topic Date Due   Fecal DNA (Cologuard)  Never done     PMH:  The following were reviewed and entered/updated in epic: Past Medical History:  Diagnosis Date   Primary hypertension 12/14/2022    Patient Active Problem List   Diagnosis Date Noted   Primary hypertension 12/14/2022   Nocturia 12/14/2022   Snoring 12/14/2022   Acquired  hypothyroidism 12/03/2022   Hyperlipidemia 12/03/2022   Class 3 severe obesity due to excess calories with body mass index (BMI) of 40.0 to 44.9 in adult [facility]) 12/03/2022   Chronic pain of left knee 12/03/2022   Abdominal distention 12/03/2022   Metabolic dysfunction-associated steatotic liver disease (MASLD) 12/03/2022    No past surgical history on file.  Family History  Problem Relation Age of Onset   Diabetes Mother    Cancer Sister     Medications- reviewed and updated No outpatient medications prior to visit.   No facility-administered medications prior to visit.    No Known Allergies  Social History   Socioeconomic History   Marital status: Single    Spouse name: Not on file   Number of children: Not on file   Years of education: Not on file   Highest education level: Bachelor's degree (e.g., BA, AB, BS)  Occupational History   Not on file  Tobacco Use   Smoking status: Never    Passive exposure: Never   Smokeless tobacco: Never  Vaping Use   Vaping status: Never Used  Substance and Sexual Activity   Alcohol use: Not Currently    Alcohol/week: 1.0 standard drink of alcohol    Types: 1 Cans of beer per week   Drug use: Never   Sexual activity: Yes    Birth control/protection: Condom  Other Topics Concern   Not on file  Social History Narrative   Not on file   Social Determinants of Health   Financial Resource Strain: Low Risk  (12/14/2022)   Overall Financial Resource Strain (CARDIA)    Difficulty of Paying Living Expenses: Not hard at all  Food Insecurity: No Food Insecurity (12/14/2022)   Hunger Vital Sign    Worried About Running Out of Food in the Last Year: Never true    Ran Out of Food in the Last Year: Never true  Transportation Needs: No Transportation Needs (12/14/2022)   PRAPARE - Administrator, Civil Service (Medical): No    Lack of Transportation (Non-Medical): No  Physical Activity: Sufficiently Active (12/14/2022)    Exercise Vital Sign    Days of Exercise per Week: 4 days    Minutes of Exercise per Session: 120 min  Stress: No Stress Concern Present (12/14/2022)   [employer] of Occupational Health - Occupational Stress Questionnaire    Feeling of Stress : Not at all  Social Connections: Moderately Integrated (12/14/2022)   Social Connection and Isolation Panel [NHANES]    Frequency of Communication with Friends and Family: More than three times a week    Frequency of Social Gatherings with Friends and Family: More than three times a week    Attends Religious Services: More than 4 times per year    Active Member of [employer] or Organizations: Yes    Attends Engineer, structural: More than 4 times per year    Marital Status: Never married  Objective:  Physical Exam: BP (!) 140/80   Pulse 76   Temp 97.6 F (36.4 C) (Temporal)   Ht 5' 10.5" (1.791 m)   Wt (!) 308 lb 6.4 oz (139.9 kg)   SpO2 99%   BMI 43.63 kg/m   Body mass index is 43.63 kg/m. Wt Readings from Last 3 Encounters:  12/14/22 (!) 308 lb 6.4 oz (139.9 kg)  12/03/22 (!) 311 lb 9.6 oz (141.3 kg)  10/11/21 280 lb (127 kg)    Physical Exam Constitutional:      General: He is not in acute distress.    Appearance: Normal appearance. He is not ill-appearing or toxic-appearing.  HENT:     Head: Normocephalic and atraumatic.     Right Ear: Hearing, tympanic membrane, ear canal and external ear normal. There is no impacted cerumen.     Left Ear: Hearing, tympanic membrane, ear canal and external ear normal. There is no impacted cerumen.     Nose: Nose normal. No congestion.     Mouth/Throat:     Lips: No lesions.     Mouth: Mucous membranes are moist.     Pharynx: Oropharynx is clear. No oropharyngeal exudate.  Eyes:     General: No scleral icterus.       Right eye: No discharge.        Left eye: No discharge.     Extraocular Movements: Extraocular movements intact.     Conjunctiva/sclera: Conjunctivae  normal.     Pupils: Pupils are equal, round, and reactive to light.  Neck:     Thyroid: No thyroid mass, thyromegaly or thyroid tenderness.  Cardiovascular:     Rate and Rhythm: Normal rate and regular rhythm.     Pulses: Normal pulses.     Heart sounds: Normal heart sounds.  Pulmonary:     Effort: Pulmonary effort is normal. No respiratory distress.     Breath sounds: Normal breath sounds.  Abdominal:     General: Abdomen is flat. Bowel sounds are normal. There is distension (easily reducable).     Palpations: Abdomen is soft. There is no mass.     Tenderness: There is no abdominal tenderness.    Musculoskeletal:        General: Normal range of motion.     Cervical back: Normal range of motion.     Right knee: Normal.     Left knee: No swelling, deformity, effusion or erythema. Normal range of motion. No tenderness. Normal alignment, normal meniscus and normal patellar mobility.     Right lower leg: No edema.     Left lower leg: No edema.  Lymphadenopathy:     Cervical: No cervical adenopathy.  Skin:    General: Skin is warm and dry.     Findings: No rash.  Neurological:     General: No focal deficit present.     Mental Status: He is alert and oriented to person, place, and time. Mental status is at baseline.     Cranial Nerves: No cranial nerve deficit.     Deep Tendon Reflexes:     Reflex Scores:      Patellar reflexes are 2+ on the right side and 2+ on the left side. Psychiatric:        Mood and Affect: Mood normal.        Behavior: Behavior normal.        Thought Content: Thought content normal.        Judgment: Judgment normal.  At today's visit, we discussed treatment options, associated risk and benefits, and engage in counseling as needed.  Additionally the following were reviewed: Past medical records, past medical and surgical history, family and social background, as well as relevant laboratory results, imaging findings, and specialty notes, where  applicable.  This message was generated using dictation software, and as a result, it may contain unintentional typos or errors.  Nevertheless, extensive effort was made to accurately convey at the pertinent aspects of the patient visit.    There may have been are other unrelated non-urgent complaints, but due to the busy schedule and the amount of time already spent with him, time does not permit to address these issues at today's visit. Another appointment may have or has been requested to review these additional issues.     Thomes Dinning, MD, MS

## 2022-12-14 NOTE — Assessment & Plan Note (Signed)
Reports loud snoring and daytime fatigue; risk factors include obesity (BMI 44).  Plan:  Order a home sleep study to evaluate for obstructive sleep apnea. Educate on the health implications of untreated sleep apnea.

## 2022-12-14 NOTE — Assessment & Plan Note (Signed)
Reducible, non-tender abdominal bulge; ultrasound unremarkable; CT scan scheduled for further evaluation.  Plan:  Proceed with scheduled abdominal CT scan to investigate the cause of the bulge.

## 2022-12-14 NOTE — Assessment & Plan Note (Signed)
Unmanaged; current thyroid status unknown.  Plan:  Order TSH to assess current levels. Restart levothyroxine therapy based on lab results.

## 2022-12-14 NOTE — Patient Instructions (Signed)
-   Purchase an upper arm blood pressure cuff and monitor your blood pressure at home most days of the week, following the proper technique provided. - Complete your blood work today across the hall, including tests for cholesterol, glucose, kidney function, thyroid, liver function, and a complete blood count. - Attend your scheduled CT scan on Friday to further evaluate the abdominal bulge. - Our staff will coordinate your Cologuard test for colon cancer screening; please complete it as instructed. - We will arrange a home sleep study to assess for sleep apnea; please follow the given instructions when provided.

## 2022-12-14 NOTE — Assessment & Plan Note (Signed)
Elevated BMI 44 kg/m; contributes to elevated blood pressure, risk of sleep apnea, and fatty liver disease.  Plan:  Provide diet and exercise recommendations via the patient portal. Encourage gradual weight loss through lifestyle changes. Discuss potential benefits of weight reduction on comorbid conditions.

## 2022-12-14 NOTE — Assessment & Plan Note (Signed)
Newly diagnosed hypertension.  Plan:  Recommend obtaining a home blood pressure cuff (upper arm type) and monitoring blood pressure at home most days of the week. Provide information on proper blood pressure measurement techniques. Order baseline lab work, including metabolic panel, lipid panel, fasting glucose, HbA1c, urinalysis, and kidney function tests. Review home blood pressure readings at the next appointment in approximately one month. Compare patient's home readings with clinic measurements to assess consistency.

## 2022-12-14 NOTE — Assessment & Plan Note (Signed)
History of elevated LDL (113 mg/dL in 0981). Plan: Order lipid panel

## 2022-12-14 NOTE — Assessment & Plan Note (Addendum)
Frequent nighttime urination; possible etiologies include benign prostatic hyperplasia (BPH) or diabetes mellitus.  Plan:  Include PSA testing to assess prostate health. Check fasting blood glucose and HbA1c to evaluate for diabetes. Urinalysis to rule out infection Monitor urinary symptoms. Consider urology referral if symptoms persist or worsen.

## 2022-12-17 ENCOUNTER — Ambulatory Visit (HOSPITAL_BASED_OUTPATIENT_CLINIC_OR_DEPARTMENT_OTHER)
Admission: RE | Admit: 2022-12-17 | Discharge: 2022-12-17 | Disposition: A | Discharge status: Home / Self Care | Payer: BC Managed Care – PPO | Source: Ambulatory Visit | Attending: Family Medicine

## 2022-12-17 DIAGNOSIS — K4090 Unilateral inguinal hernia, without obstruction or gangrene, not specified as recurrent: Secondary | ICD-10-CM | POA: Diagnosis not present

## 2022-12-17 DIAGNOSIS — N2889 Other specified disorders of kidney and ureter: Secondary | ICD-10-CM | POA: Diagnosis not present

## 2022-12-17 DIAGNOSIS — R140 Abdominal distension (gaseous): Secondary | ICD-10-CM | POA: Diagnosis not present

## 2022-12-17 DIAGNOSIS — K429 Umbilical hernia without obstruction or gangrene: Secondary | ICD-10-CM | POA: Diagnosis not present

## 2022-12-17 DIAGNOSIS — K439 Ventral hernia without obstruction or gangrene: Secondary | ICD-10-CM | POA: Diagnosis not present

## 2022-12-17 MED ORDER — LEVOTHYROXINE SODIUM 50 MCG PO TABS: 50.0000 ug | ORAL_TABLET | Freq: Every day | ORAL | 3 refills | Status: DC

## 2022-12-17 NOTE — Addendum Note (Signed)
Addended by: Fanny Bien B on: 12/17/2022 12:50 PM   Modules accepted: Orders

## 2022-12-19 ENCOUNTER — Ambulatory Visit (HOSPITAL_BASED_OUTPATIENT_CLINIC_OR_DEPARTMENT_OTHER)
Admission: RE | Admit: 2022-12-19 | Discharge: 2022-12-19 | Disposition: A | Discharge status: Home / Self Care | Payer: BC Managed Care – PPO | Source: Ambulatory Visit | Attending: Physician Assistant | Admitting: Physician Assistant

## 2022-12-19 DIAGNOSIS — M7122 Synovial cyst of popliteal space [Baker], left knee: Secondary | ICD-10-CM | POA: Diagnosis not present

## 2022-12-19 DIAGNOSIS — G8929 Other chronic pain: Secondary | ICD-10-CM | POA: Diagnosis not present

## 2022-12-19 DIAGNOSIS — M1712 Unilateral primary osteoarthritis, left knee: Secondary | ICD-10-CM | POA: Diagnosis not present

## 2022-12-19 DIAGNOSIS — M25462 Effusion, left knee: Secondary | ICD-10-CM | POA: Diagnosis not present

## 2022-12-19 DIAGNOSIS — M25562 Pain in left knee: Secondary | ICD-10-CM | POA: Insufficient documentation

## 2022-12-19 DIAGNOSIS — S83232A Complex tear of medial meniscus, current injury, left knee, initial encounter: Secondary | ICD-10-CM | POA: Diagnosis not present

## 2022-12-20 ENCOUNTER — Telehealth: Payer: Self-pay | Admitting: Family Medicine

## 2022-12-20 ENCOUNTER — Encounter: Payer: Medicaid - Out of State | Admitting: Family Medicine

## 2022-12-20 DIAGNOSIS — E039 Hypothyroidism, unspecified: Secondary | ICD-10-CM

## 2022-12-20 NOTE — Telephone Encounter (Signed)
Pt want to know if you can give enocrinology referral and wher he prefer  to go is at [facility] [address] to see dr [facility]. Please call the patient about this

## 2022-12-22 ENCOUNTER — Other Ambulatory Visit: Payer: Self-pay | Admitting: Medical Genetics

## 2023-01-03 ENCOUNTER — Telehealth: Payer: Self-pay

## 2023-01-03 NOTE — Telephone Encounter (Signed)
-----   Message from [place] B[place] Persons sent at 01/03/2023 11:50 AM EST ----- I spoke with the patient he does enjoy running and has not been able to run since this injury does not have a lot of arthritis in his knee.  He is interested in discussing the MRI and possible arthroscopy.  Cache Bills could you get him into see Dr. August Saucer.  Thank you ----- Message ----- From: Interface, Rad Results In Sent: 12/31/2022  12:04 PM EST To: [place] B[place] Persons, PA

## 2023-01-03 NOTE — Telephone Encounter (Signed)
Tried calling patient to schedule appointment. No answer. LMVM for patient to return call to schedule appt next available with Dr August Saucer.

## 2023-01-17 ENCOUNTER — Other Ambulatory Visit (HOSPITAL_BASED_OUTPATIENT_CLINIC_OR_DEPARTMENT_OTHER): Payer: Self-pay | Admitting: Family Medicine

## 2023-01-17 DIAGNOSIS — K436 Other and unspecified ventral hernia with obstruction, without gangrene: Secondary | ICD-10-CM

## 2023-01-17 DIAGNOSIS — K4090 Unilateral inguinal hernia, without obstruction or gangrene, not specified as recurrent: Secondary | ICD-10-CM

## 2023-01-18 ENCOUNTER — Ambulatory Visit: Payer: BC Managed Care – PPO | Admitting: Family Medicine

## 2023-01-26 ENCOUNTER — Other Ambulatory Visit: Payer: Self-pay

## 2023-01-26 DIAGNOSIS — E039 Hypothyroidism, unspecified: Secondary | ICD-10-CM

## 2023-01-28 ENCOUNTER — Institutional Professional Consult (permissible substitution): Payer: BC Managed Care – PPO | Admitting: Neurology

## 2023-02-02 ENCOUNTER — Other Ambulatory Visit: Payer: BC Managed Care – PPO

## 2023-02-02 ENCOUNTER — Ambulatory Visit: Payer: BC Managed Care – PPO | Admitting: Orthopedic Surgery

## 2023-02-02 ENCOUNTER — Encounter: Payer: Self-pay | Admitting: Orthopedic Surgery

## 2023-02-02 VITALS — Ht 71.26 in | Wt 305.6 lb

## 2023-02-02 DIAGNOSIS — M1712 Unilateral primary osteoarthritis, left knee: Secondary | ICD-10-CM

## 2023-02-02 DIAGNOSIS — E039 Hypothyroidism, unspecified: Secondary | ICD-10-CM | POA: Diagnosis not present

## 2023-02-02 MED ORDER — LIDOCAINE HCL 1 % IJ SOLN
5.0000 mL | INTRAMUSCULAR | Status: AC | PRN
  Administered 2023-02-02: 5 mL

## 2023-02-02 MED ORDER — BUPIVACAINE HCL 0.25 % IJ SOLN
4.0000 mL | INTRAMUSCULAR | Status: AC | PRN
  Administered 2023-02-02: 4 mL via INTRA_ARTICULAR

## 2023-02-02 MED ORDER — METHYLPREDNISOLONE ACETATE 40 MG/ML IJ SUSP
40.0000 mg | INTRAMUSCULAR | Status: AC | PRN
  Administered 2023-02-02: 40 mg via INTRA_ARTICULAR

## 2023-02-02 NOTE — Progress Notes (Signed)
None  Office Visit Note   Patient: Ashe Kopper           Date of Birth: Feb 25, 1970           MRN: [id number] Visit Date: 02/02/2023 Requested by: Garnette Gunner, MD [address] [place],  [place] [address] PCP: Garnette Gunner, MD  Subjective: Chief Complaint  Patient presents with   Left Knee - Pain    HPI: Shanon Bonomi is a 53 y.o. male who presents to the office reporting left knee pain.  Reports increased left knee pain since May.  Feels like he may have hyperextended it while running.  Reports primarily medial sided pain.  No prior surgery no prior injections.  After a mile run he starts to limp.  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  Lateral and patellofemoral compartments appear spared..                ROS: All systems reviewed are negative as they relate to the chief complaint within the history of present illness.  Patient denies fevers or chills.  Assessment & Plan: Visit Diagnoses: No diagnosis found.  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  This is a tough situation for Joe.  He is a runner and wants to continue running.  I think that would be detrimental to the long-term health of his knee.  We talked about injection today which is performed.  6-week return for clinical recheck on effusion which he has none of today and response to injection.  Nonload bearing quad strengthening exercises encouraged.  Running well be a significantly increased force on that arthritic medial compartment which has meniscal pathology.  Arthroscopic debridement could help remove that part of the meniscus which is incongruous and might be contributing to the wearing of the articular surface.  We can reevaluate that in 6 weeks.  Surgical rehab discussed with  the patient.  All questions answered.  Follow-Up Instructions: No follow-ups on file.   Orders:  No orders of the defined types were placed in this encounter.  No orders of the defined types were placed in this encounter.     Procedures: Large Joint Inj: L knee on 02/02/2023 8:08 PM Indications: diagnostic evaluation, joint swelling and pain Details: 18 G 1.5 in needle, superolateral approach  Arthrogram: No  Medications: 5 mL lidocaine 1 %; 40 mg methylPREDNISolone acetate 40 MG/ML; 4 mL bupivacaine 0.25 % Outcome: tolerated well, no immediate complications Procedure, treatment alternatives, risks and benefits explained, specific risks discussed. Consent was given by the patient. Immediately prior to procedure a time out was called to verify the correct patient, procedure, equipment, support staff and site/side marked as required. Patient was prepped and draped in the usual sterile fashion.       Clinical Data: No additional findings.  Objective: Vital Signs: Ht 5' 11.26" (1.81 m)   Wt (!) 305 lb 9.6 oz (138.6 kg)   BMI 42.31 kg/m   Physical Exam:  Constitutional: Patient appears well-developed HEENT:  Head: Normocephalic Eyes:EOM are normal Neck: Normal range of motion Cardiovascular: Normal rate Pulmonary/chest: Effort normal Neurologic: Patient is alert Skin: Skin is warm Psychiatric: Patient has normal mood and affect  Ortho Exam: Ortho exam demonstrates no definite effusion in the left knee.  Has full range of motion.  Excellent  quad strength bilaterally.  Collateral and cruciate ligaments are stable.  Medial joint line tenderness is present.  Specialty Comments:  No specialty comments available.  Imaging: No results found.   PMFS History: Patient Active Problem List   Diagnosis Date Noted   Primary hypertension 12/14/2022   Nocturia 12/14/2022   Snoring 12/14/2022   Acquired hypothyroidism 12/03/2022   Hyperlipidemia 12/03/2022   Class 3 severe  obesity due to excess calories with body mass index (BMI) of 40.0 to 44.9 in adult [facility]) 12/03/2022   Chronic pain of left knee 12/03/2022   Abdominal distention 12/03/2022   Metabolic dysfunction-associated steatotic liver disease (MASLD) 12/03/2022   Past Medical History:  Diagnosis Date   Primary hypertension 12/14/2022    Family History  Problem Relation Age of Onset   Diabetes Mother    Cancer Sister     No past surgical history on file. Social History   Occupational History   Not on file  Tobacco Use   Smoking status: Never    Passive exposure: Never   Smokeless tobacco: Never  Vaping Use   Vaping status: Never Used  Substance and Sexual Activity   Alcohol use: Not Currently    Alcohol/week: 1.0 standard drink of alcohol    Types: 1 Cans of beer per week   Drug use: Never   Sexual activity: Yes    Birth control/protection: Condom

## 2023-02-03 LAB — TSH: TSH: 10.04 m[IU]/L — ABNORMAL HIGH (ref 0.40–4.50)

## 2023-02-03 LAB — T4, FREE: Free T4: 0.8 ng/dL (ref 0.8–1.8)

## 2023-02-08 ENCOUNTER — Ambulatory Visit (INDEPENDENT_AMBULATORY_CARE_PROVIDER_SITE_OTHER): Payer: BC Managed Care – PPO | Admitting: "Endocrinology

## 2023-02-08 ENCOUNTER — Other Ambulatory Visit (HOSPITAL_COMMUNITY)
Admission: RE | Admit: 2023-02-08 | Discharge: 2023-02-08 | Disposition: A | Discharge status: Home / Self Care | Payer: Self-pay | Source: Ambulatory Visit | Attending: Oncology | Admitting: Oncology

## 2023-02-08 ENCOUNTER — Ambulatory Visit: Payer: BC Managed Care – PPO | Admitting: Neurology

## 2023-02-08 ENCOUNTER — Encounter: Payer: Self-pay | Admitting: Neurology

## 2023-02-08 ENCOUNTER — Encounter: Payer: Self-pay | Admitting: "Endocrinology

## 2023-02-08 VITALS — BP 140/82 | HR 70 | Ht 70.5 in | Wt 314.0 lb

## 2023-02-08 VITALS — BP 138/80 | HR 78 | Resp 20 | Ht 70.5 in | Wt 313.6 lb

## 2023-02-08 DIAGNOSIS — G4734 Idiopathic sleep related nonobstructive alveolar hypoventilation: Secondary | ICD-10-CM | POA: Diagnosis not present

## 2023-02-08 DIAGNOSIS — Z6841 Body Mass Index (BMI) 40.0 and over, adult: Secondary | ICD-10-CM

## 2023-02-08 DIAGNOSIS — E66813 Obesity, class 3: Secondary | ICD-10-CM | POA: Diagnosis not present

## 2023-02-08 DIAGNOSIS — R0683 Snoring: Secondary | ICD-10-CM | POA: Diagnosis not present

## 2023-02-08 DIAGNOSIS — R351 Nocturia: Secondary | ICD-10-CM

## 2023-02-08 DIAGNOSIS — E039 Hypothyroidism, unspecified: Secondary | ICD-10-CM | POA: Diagnosis not present

## 2023-02-08 DIAGNOSIS — G4733 Obstructive sleep apnea (adult) (pediatric): Secondary | ICD-10-CM

## 2023-02-08 DIAGNOSIS — I10 Essential (primary) hypertension: Secondary | ICD-10-CM

## 2023-02-08 HISTORY — DX: Obstructive sleep apnea (adult) (pediatric): G47.33

## 2023-02-08 MED ORDER — LEVOTHYROXINE SODIUM 75 MCG PO TABS: 75.0000 ug | ORAL_TABLET | Freq: Every day | ORAL | 1 refills | Status: DC

## 2023-02-08 NOTE — Patient Instructions (Signed)
ASSESSMENT AND PLAN 53 y.o. 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    Severe apnea suggested by GF ,observation , choking , myoclonic jerking, but no palpitations.  Dreamless sleep indicates that REM sleep may be suppressed in response to OSA in REM>     3) He would like to have a PSG , will order as SPLIT study/ at AHI 40.        I plan to follow up either personally or through our NP within  3-5 months.    I would like to thank Garnette Gunner, MD and Garnette Gunner, Md [address] [place],  [place] [address] for allowing me to meet with and to take care of this pleasant patient.    CC: I will share my notes with  PCP.

## 2023-02-08 NOTE — Progress Notes (Signed)
SLEEP MEDICINE CLINIC    Provider:  Melvyn Novas, MD  Primary Care Physician:  Garnette Gunner, MD [address] [place] [address]     Referring Provider: Garnette Gunner, Md [address] [place],  [place] [address]          Chief Complaint according to patient   Patient presents with:     New Sleep  (Initial Visit)           HISTORY OF PRESENT ILLNESS:  Anthony Potts is a 53 y.o. male patient who is seen upon referral on 02/08/2023 from Dr Janee Morn for a  sleep consultation Chief concern according to patient :  " I have had surgery for deviated septum in 2007, but this didn't help, I have snored for decades and  I feel not sleepy but am used to 4-5 hours of sleep, I have operated this way for years".  " When I am neither stimulated nor active , I can fall asleep" . 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    I have the pleasure of seeing Maurisio Ruddy on  02/08/23, who is  a right-handed Caucasian  male with a long standing  sleep disorder.     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  HTN, Obesity- accelerated when he weaned off synthroid. Marland Kitchen .Family medical Verne Spurr history: no  other family member on CPAP with OSA, sister with insomnia but has been a Charity fundraiser and shift working.   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 Shift work.   and lives in a household alone. Family status is single, with 2 children twins age 52 . Pets are not present. Tobacco use: none .  ETOH use ; social- 1-2/ months. Caffeine intake in form of Coffee( /) Soda( /) Tea ( /) no  energy drinks Exercise in form of walking, limited by knee pain.  Eating out.       Sleep habits are as follows: Off work at 3.30 The patient's dinner time is between 6-8  PM. The patient goes to bed at 11-12 PM and continues to sleep for 4-5 hours, wakes for several  bathroom breaks,   The preferred sleep position is prone , with the support of 1 pillows. No GERD. Dreams are reportedly rare. The patient wakes up spontaneously before his alarm. 5.00  AM is the usual rise time. He tries to go to the gym before work.  He  reports  feeling refreshed or restored in AM, with symptoms such as dry mouth, no morning headaches, and residual fatigue.  Naps are taken infrequently, lasting from 5 to 15 minutes and are refreshing t.   Review of Systems: Out of a complete 14 system review, the patient complains of only the following symptoms, and all other reviewed systems are negative.:  Fatigue, sleepiness , snoring, fragmented sleep, Insomnia, Nocturia    How likely are you to doze in the following situations: 0 = not likely, 1 = slight chance, 2 = moderate chance, 3 = high chance   Sitting and Reading? Watching Television? Sitting inactive in a public place (theater or meeting)? As a passenger in a car for an hour without a break? Lying down in the afternoon when circumstances permit?  Sitting and talking to someone? Sitting quietly after lunch without alcohol? In a car, while stopped for a few minutes in traffic?   Total = 4/ 24 points   FSS endorsed at 12/ 63 points.   Social History   Socioeconomic History   Marital status: Single    Spouse name: Not on file   Number of children: 2   Years of education: Not on file   Highest education level: Bachelor's degree (e.g., BA, AB, BS)  Occupational History   Not on file  Tobacco Use   Smoking status: Never    Passive exposure: Never   Smokeless tobacco: Never  Vaping Use   Vaping status: Never Used  Substance and Sexual Activity   Alcohol use: Not Currently    Alcohol/week: 1.0 standard drink of alcohol    Types: 1 Cans of beer per week   Drug use: Never   Sexual activity: Yes    Birth  control/protection: Condom  Other Topics Concern   Not on file  Social History Narrative   Right handed    Wears reader glasses    Drinks fanta orange ( occ)   Social Drivers of Health   Financial Resource Strain: Low Risk  (12/14/2022)   Overall Financial Resource Strain (CARDIA)    Difficulty of Paying Living Expenses: Not hard at all  Food Insecurity: No Food Insecurity (12/14/2022)   Hunger Vital Sign    Worried About Running Out of Food in the Last Year: Never true    Ran Out of Food in the Last Year: Never true  Transportation Needs: No Transportation Needs (12/14/2022)   PRAPARE - Administrator, Civil Service (Medical): No    Lack of Transportation (Non-Medical): No  Physical Activity: Sufficiently Active (12/14/2022)   Exercise Vital Sign    Days of Exercise per Week: 4 days    Minutes of Exercise per Session: 120 min  Stress: No Stress Concern Present (12/14/2022)   [employer] of Occupational Health - Occupational Stress Questionnaire    Feeling of Stress : Not at all  Social Connections: Moderately Integrated (12/14/2022)   Social Connection and Isolation Panel [NHANES]    Frequency of Communication with Friends and Family: More than three times a week    Frequency of Social Gatherings with Friends and Family: More than three times a week    Attends Religious Services: More than 4 times per year    Active Member of [employer] or Organizations: Yes    Attends Engineer, structural: More than 4 times per year    Marital Status: Never married    Family History  Problem Relation Age of Onset   Diabetes Mother    Cancer Sister     Past Medical History:  Diagnosis Date   Primary hypertension 12/14/2022    History reviewed. No pertinent surgical history.   Current Outpatient Medications on File Prior to Visit  Medication Sig Dispense Refill   Blood Pressure Monitoring (BLOOD PRESSURE CUFF) MISC 1 Device by Does not apply route as needed (check  blood pressure at home). 1 each 0   levothyroxine (SYNTHROID) 50 MCG tablet Take 1 tablet (50 mcg total) by mouth daily. (Patient not taking: Reported on 02/08/2023) 90 tablet 3   No current facility-administered medications on file prior to visit.    No Known Allergies   DIAGNOSTIC DATA (LABS, IMAGING, TESTING) - I reviewed patient records, labs, notes, testing and imaging myself where available.  Lab Results  Component Value Date   WBC 5.9 12/14/2022   HGB 16.7 12/14/2022   HCT 49.4 12/14/2022   MCV 94.2 12/14/2022   PLT 185.0 12/14/2022      Component Value Date/Time   NA 140 12/14/2022 0843   K 4.4 12/14/2022 0843   CL 106 12/14/2022 0843   CO2 30 12/14/2022 0843   GLUCOSE 97 12/14/2022 0843   BUN 17 12/14/2022 0843   CREATININE 0.81 12/14/2022 0843   CALCIUM 8.9 12/14/2022 0843   PROT 6.9 12/14/2022 0843   ALBUMIN 4.0 12/14/2022 0843   AST 35 12/14/2022 0843   ALT 47 12/14/2022 0843   ALKPHOS 61 12/14/2022 0843   BILITOT 0.5 12/14/2022 0843   Lab Results  Component Value Date   CHOL 195 12/14/2022   HDL 36.90 (L) 12/14/2022   LDLCALC 133 (H) 12/14/2022   TRIG 125.0 12/14/2022   CHOLHDL 5 12/14/2022   Lab Results  Component Value Date   HGBA1C 5.9 12/14/2022   No results found for: "VITAMINB12" Lab Results  Component Value Date   TSH 10.04 (H) 02/02/2023    PHYSICAL EXAM:  Today's Vitals   02/08/23 0853 02/08/23 0859  BP: (!) 139/99 (!) 140/82  Pulse: 70   Weight: (!) 314 lb (142.4 kg)   Height: 5' 10.5" (1.791 m)    Body mass index is 44.42 kg/m.   Wt Readings from Last 3 Encounters:  02/08/23 (!) 314 lb (142.4 kg)  02/02/23 (!) 305 lb 9.6 oz (138.6 kg)  12/14/22 (!) 308 lb 6.4 oz (139.9 kg)     Ht Readings from Last 3 Encounters:  02/08/23 5' 10.5" (1.791 m)  02/02/23 5' 11.26" (1.81 m)  12/14/22 5' 10.5" (1.791 m)      General: The patient is awake, alert and appears not in acute distress. The patient is well groomed.  Has chewed  through night guards, bruxism.  Head: Normocephalic, atraumatic. Neck is supple. Mallampati 2-3 ,  neck circumference:21 inches .  Nasal airflow is  patent.  Retrognathia is not seen.  Dental status: intact  Cardiovascular:  Regular rate and cardiac rhythm by pulse,  without distended neck veins. Respiratory: Lungs are clear to auscultation.  Skin:  Without evidence of ankle edema, or rash. Trunk: The patient's posture is erect.   NEUROLOGIC EXAM: The patient is awake and alert, oriented to place and time.   Memory subjective described as intact.  Attention span & concentration ability appears normal.  Speech is fluent,  without  dysarthria, dysphonia or aphasia.  Mood and affect are appropriate.   Cranial nerves: no loss of smell or taste reported  Pupils are equal and briskly reactive to light. Funduscopic exam deferred. .  Extraocular movements in vertical and horizontal planes were intact and without nystagmus. No Diplopia. Visual fields by finger perimetry are intact. Hearing was intact to soft voice and finger rubbing.    Facial sensation intact to fine touch.  Facial motor strength is symmetric and tongue and uvula move midline.  Neck ROM : rotation, tilt and flexion extension were normal for age and shoulder shrug was symmetrical.    Motor exam:  Symmetric bulk, tone and ROM.   Normal tone without cog wheeling, symmetric grip strength .   Sensory:  Fine touch, pinprick and vibration were tested  and  normal.  Proprioception tested in the upper extremities was normal.   Coordination: Rapid alternating movements in the fingers/hands were of normal speed.  The Finger-to-nose maneuver was intact without evidence  of ataxia, dysmetria or tremor.   Gait and station: Patient could rise unassisted from a seated position, walked without assistive device.  Stance is of normal width/ base and the patient turned with 3 steps ( observation) .  Toe and heel walk were deferred.  Deep  tendon reflexes: in the  upper and lower extremities are symmetric and intact.  Babinski response was deferred .    ASSESSMENT AND PLAN 53 y.o. 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   Severe apnea suggested by GF ,observation , choking , myoclonic jerking, but no palpitations.  Dreamless sleep indicates that REM sleep may be suppressed in response to OSA in REM>    3) He would like to have a PSG , will order as SPLIT study/ at AHI 40.     I plan to follow up either personally or through our NP within  3-5 months.   I would like to thank Garnette Gunner, MD and Garnette Gunner, Md [address] [place],  [place] [address] for allowing me to meet with and to take care of this pleasant patient.   CC: I will share my notes with  PCP.  After spending a total time of  40  minutes face to face and additional time for physical and neurologic examination, review of laboratory studies,  personal review of imaging studies, reports and results of other testing and review of referral information / records as far as provided in visit,   Electronically signed by: Melvyn Novas, MD 02/08/2023 9:38 AM  [facility] and [facility] Sleep Board certified by The [employer] of Sleep Medicine and Diplomate of the [employer] of Sleep Medicine. Board certified In Neurology through the ABPN, Fellow of the [employer] of Neurology.

## 2023-02-08 NOTE — Progress Notes (Signed)
Outpatient Endocrinology Note Altamese Mission, MD  02/08/23   Anthony Potts 1970-08-17 [id number]  Referring Provider: Garnette Gunner, MD Primary Care Provider: Garnette Gunner, MD Subjective  No chief complaint on file.   Assessment & Plan  Diagnoses and all orders for this visit:  Acquired hypothyroidism -     TSH(Reflex)  Other orders -     levothyroxine (SYNTHROID) 75 MCG tablet; Take 1 tablet (75 mcg total) by mouth daily.   Anthony Potts is currently not taking any thyroid medication. Patient is currently biochemically hypothyroid.  Educated on thyroid axis.  Recommend the following: Take levothyroxine 75 mcg every morning.  Advised to take levothyroxine first thing in the morning on empty stomach and wait at least 30 minutes to 1 hour before eating or drinking anything or taking any other medications. Space out levothyroxine by 4 hours from any acid reflux medication/fibrate/iron/calcium/multivitamin. Advised to take nutritional supplements 4 hours apart. Repeat lab before next visit or sooner if symptoms of hyperthyroidism or hypothyroidism develop.  Notify us immediately in case of significant weight gain or loss. Counseled on compliance and follow up needs.  I have reviewed current medications, nurse's notes, allergies, vital signs, past medical and surgical history, family medical history, and social history for this encounter. Counseled patient on symptoms, examination findings, lab findings, imaging results, treatment decisions and monitoring and prognosis. The patient understood the recommendations and agrees with the treatment plan. All questions regarding treatment plan were fully answered.   Return in about 3 months (around 05/09/2023) for visit + labs before next visit.   Altamese Wentworth, MD  02/08/23   I have reviewed current medications, nurse's notes, allergies, vital signs, past medical and surgical history, family medical history, and  social history for this encounter. Counseled patient on symptoms, examination findings, lab findings, imaging results, treatment decisions and monitoring and prognosis. The patient understood the recommendations and agrees with the treatment plan. All questions regarding treatment plan were fully answered.   History of Present Illness Anthony Potts is a 53 y.o. year old male who presents to our clinic with hypothyroidism diagnosed in 2002.    Stopped taking synthroid since 2008 due to affordability issues. Currently not taking any thyroid medication.   Symptoms suggestive of HYPOTHYROIDISM:  fatigue No weight gain Yes cold intolerance  No constipation  No  Symptoms suggestive of HYPERTHYROIDISM:  weight loss  No heat intolerance No hyperdefecation  No palpitations  No  Compressive symptoms:  dysphagia  No dysphonia  No positional dyspnea (especially with simultaneous arms elevation)  No  Smokes  No On biotin  No Personal history of head/neck surgery/irradiation  No  Physical Exam  BP 138/80 (BP Location: Left Arm, Patient Position: Sitting, Cuff Size: Large)   Pulse 78   Resp 20   Ht 5' 10.5" (1.791 m)   Wt (!) 313 lb 9.6 oz (142.2 kg)   SpO2 93%   BMI 44.36 kg/m  Constitutional: well developed, well nourished Head: normocephalic, atraumatic, no exophthalmos Eyes: sclera anicteric, no redness Neck: no thyromegaly, no thyroid tenderness; no nodules palpated Lungs: normal respiratory effort Neurology: alert and oriented, no fine hand tremor Skin: dry, no appreciable rashes Musculoskeletal: no appreciable defects Psychiatric: normal mood and affect  Allergies No Known Allergies  Current Medications Patient's Medications  New Prescriptions   LEVOTHYROXINE (SYNTHROID) 75 MCG TABLET    Take 1 tablet (75 mcg total) by mouth daily.  Previous Medications   BLOOD PRESSURE  MONITORING (BLOOD PRESSURE CUFF) MISC    1 Device by Does not apply route as needed (check blood  pressure at home).  Modified Medications   No medications on file  Discontinued Medications   LEVOTHYROXINE (SYNTHROID) 50 MCG TABLET    Take 1 tablet (50 mcg total) by mouth daily.    Past Medical History Past Medical History:  Diagnosis Date   Chronic intermittent hypoxia with obstructive sleep apnea 02/08/2023   Primary hypertension 12/14/2022    Past Surgical History History reviewed. No pertinent surgical history.  Family History family history includes Cancer in his sister; Diabetes in his mother.  Social History Social History   Socioeconomic History   Marital status: Single    Spouse name: Not on file   Number of children: 2   Years of education: Not on file   Highest education level: Bachelor's degree (e.g., BA, AB, BS)  Occupational History   Not on file  Tobacco Use   Smoking status: Never    Passive exposure: Never   Smokeless tobacco: Never  Vaping Use   Vaping status: Never Used  Substance and Sexual Activity   Alcohol use: Not Currently    Alcohol/week: 1.0 standard drink of alcohol    Types: 1 Cans of beer per week   Drug use: Never   Sexual activity: Yes    Birth control/protection: Condom  Other Topics Concern   Not on file  Social History Narrative   Right handed    Wears reader glasses    Drinks fanta orange ( occ)   Social Drivers of Health   Financial Resource Strain: Low Risk  (12/14/2022)   Overall Financial Resource Strain (CARDIA)    Difficulty of Paying Living Expenses: Not hard at all  Food Insecurity: No Food Insecurity (12/14/2022)   Hunger Vital Sign    Worried About Running Out of Food in the Last Year: Never true    Ran Out of Food in the Last Year: Never true  Transportation Needs: No Transportation Needs (12/14/2022)   PRAPARE - Administrator, Civil Service (Medical): No    Lack of Transportation (Non-Medical): No  Physical Activity: Sufficiently Active (12/14/2022)   Exercise Vital Sign    Days of Exercise per  Week: 4 days    Minutes of Exercise per Session: 120 min  Stress: No Stress Concern Present (12/14/2022)   [employer] of Occupational Health - Occupational Stress Questionnaire    Feeling of Stress : Not at all  Social Connections: Moderately Integrated (12/14/2022)   Social Connection and Isolation Panel [NHANES]    Frequency of Communication with Friends and Family: More than three times a week    Frequency of Social Gatherings with Friends and Family: More than three times a week    Attends Religious Services: More than 4 times per year    Active Member of Clubs or Organizations: Yes    Attends Banker Meetings: More than 4 times per year    Marital Status: Never married  Intimate Partner Violence: Not on file    Laboratory Investigations Lab Results  Component Value Date   TSH 10.04 (H) 02/02/2023   TSH 11.17 (H) 12/14/2022   FREET4 0.8 02/02/2023     No results found for: "TSI"   No components found for: "TRAB"   Lab Results  Component Value Date   CHOL 195 12/14/2022   Lab Results  Component Value Date   HDL 36.90 (L) 12/14/2022   Lab  Results  Component Value Date   LDLCALC 133 (H) 12/14/2022   Lab Results  Component Value Date   TRIG 125.0 12/14/2022   Lab Results  Component Value Date   CHOLHDL 5 12/14/2022   Lab Results  Component Value Date   CREATININE 0.81 12/14/2022   Lab Results  Component Value Date   GFR 103.02 12/14/2022      Component Value Date/Time   NA 140 12/14/2022 0843   K 4.4 12/14/2022 0843   CL 106 12/14/2022 0843   CO2 30 12/14/2022 0843   GLUCOSE 97 12/14/2022 0843   BUN 17 12/14/2022 0843   CREATININE 0.81 12/14/2022 0843   CALCIUM 8.9 12/14/2022 0843   PROT 6.9 12/14/2022 0843   ALBUMIN 4.0 12/14/2022 0843   AST 35 12/14/2022 0843   ALT 47 12/14/2022 0843   ALKPHOS 61 12/14/2022 0843   BILITOT 0.5 12/14/2022 0843      Latest Ref Rng & Units 12/14/2022    8:43 AM  BMP  Glucose 70 - 99 mg/dL 97    BUN 6 - 23 mg/dL 17   Creatinine 1.47 - 1.50 mg/dL 8.29   Sodium 562 - 130 mEq/L 140   Potassium 3.5 - 5.1 mEq/L 4.4   Chloride 96 - 112 mEq/L 106   CO2 19 - 32 mEq/L 30   Calcium 8.4 - 10.5 mg/dL 8.9        Component Value Date/Time   WBC 5.9 12/14/2022 0843   RBC 5.24 12/14/2022 0843   HGB 16.7 12/14/2022 0843   HCT 49.4 12/14/2022 0843   PLT 185.0 12/14/2022 0843   MCV 94.2 12/14/2022 0843   MCHC 33.7 12/14/2022 0843   RDW 13.8 12/14/2022 0843   LYMPHSABS 2.4 12/14/2022 0843   MONOABS 0.5 12/14/2022 0843   EOSABS 0.2 12/14/2022 0843   BASOSABS 0.1 12/14/2022 0843      Parts of this note may have been dictated using voice recognition software. There may be variances in spelling and vocabulary which are unintentional. Not all errors are proofread. Please notify the Thereasa Parkin if any discrepancies are noted or if the meaning of any statement is not clear.

## 2023-02-16 ENCOUNTER — Telehealth: Payer: Self-pay | Admitting: Neurology

## 2023-02-16 DIAGNOSIS — G4733 Obstructive sleep apnea (adult) (pediatric): Secondary | ICD-10-CM

## 2023-02-16 DIAGNOSIS — R351 Nocturia: Secondary | ICD-10-CM

## 2023-02-16 DIAGNOSIS — I10 Essential (primary) hypertension: Secondary | ICD-10-CM

## 2023-02-16 DIAGNOSIS — R0683 Snoring: Secondary | ICD-10-CM

## 2023-02-16 NOTE — Telephone Encounter (Signed)
[employer] denied the Split. You want to order a HST?

## 2023-02-16 NOTE — Telephone Encounter (Signed)
 Noted, thank you.  HST BC[employer] Siegfried Dress: [id number] (exp. 02/16/23 to 04/16/23)

## 2023-02-16 NOTE — Telephone Encounter (Signed)
 HST is ordered since in lab denied

## 2023-02-18 DIAGNOSIS — Z1211 Encounter for screening for malignant neoplasm of colon: Secondary | ICD-10-CM | POA: Diagnosis not present

## 2023-02-20 NOTE — Telephone Encounter (Signed)
 Insurance denied in lab study -I  have switched to HST.

## 2023-02-21 LAB — GENECONNECT MOLECULAR SCREEN: Genetic Analysis Overall Interpretation: NEGATIVE

## 2023-02-23 ENCOUNTER — Ambulatory Visit: Payer: BC Managed Care – PPO | Admitting: Neurology

## 2023-02-23 DIAGNOSIS — R0683 Snoring: Secondary | ICD-10-CM

## 2023-02-23 DIAGNOSIS — G4733 Obstructive sleep apnea (adult) (pediatric): Secondary | ICD-10-CM

## 2023-02-23 DIAGNOSIS — I10 Essential (primary) hypertension: Secondary | ICD-10-CM

## 2023-02-23 DIAGNOSIS — R351 Nocturia: Secondary | ICD-10-CM

## 2023-02-24 LAB — COLOGUARD: COLOGUARD: NEGATIVE

## 2023-02-25 ENCOUNTER — Encounter: Payer: Self-pay | Admitting: Family Medicine

## 2023-03-01 NOTE — Progress Notes (Unsigned)
 Marland Kitchen

## 2023-03-03 ENCOUNTER — Telehealth: Payer: Self-pay | Admitting: Neurology

## 2023-03-03 NOTE — Telephone Encounter (Signed)
 Just want to check to see if data was received? Pt is asking if he can mail back device.

## 2023-03-03 NOTE — Telephone Encounter (Signed)
 Pt calling to check if got home sleep study results. Wanted to know before mailing back the device. Would like a call back.

## 2023-03-04 ENCOUNTER — Encounter: Payer: Self-pay | Admitting: Neurology

## 2023-03-04 NOTE — Procedures (Signed)
 [facility]  Anthony Potts "Joe" 53 year old male [birth year]   HOME SLEEP TEST REPORT ( by Watch PAT)  MAIL OUT test STUDY DATE: 02-25-2023     DATA loaded 02-23-2023   ORDERING CLINICIAN:  REFERRING CLINICIAN:    CLINICAL INFORMATION/HISTORY: Anthony Potts is a 53 y.o. male patient who is seen upon referral from Dr Janee Morn on 02/08/2023 for a sleep consultation Chief concern according to patient :  " I have had surgery for a deviated septum nasi in 2007, but this didn't help, I have snored for decades and  I feel not sleepy . I am used to 4-5 hours of sleep, I have operated this way for years".  " When I am neither stimulated nor active I can fall asleep" . 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    I have the pleasure of seeing Anthony Potts on  02/08/23, who is  a right-handed Caucasian  male with a long standing  sleep disorder, nasal patency problems and night shift worker.  Hypothyroidism and related weight gain.  Bruxism and night guard wearer. Nocturia 2-3 times , deviated septum repair, wisdom teeth extracted,  HTN, Obesity- accelerated when he weaned off synthroid.  Power naps are refreshing.  Family medical /sleep history: no  other family member with OSA, sister with insomnia but has been shift working as Charity fundraiser.  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 joined the [employer] at age 64. Almost always shift work- ongoing from there . Patient is working at [employer], in [place], at the new Social worker. Shift work.      Epworth sleepiness score: 4/ 24 points   FSS endorsed at 12/ 63 points.    BMI: 44.4 kg/m   Neck Circumference: 21 "   FINDINGS:   Sleep Summary:   Total Recording Time (hours, min): 7 hours 51 minutes      Total Sleep Time (hours, min): 6 hours 55 minutes               Percent REM (%):     20%                                   Respiratory Indices:   Calculated pAHI (per  hour):    58.2/h                         REM pAHI:     56/h                                            NREM pAHI:    59/h                          Positional AHI:    The patient slept 268 minutes in supine associated with an AHI of 61.8/h.  Sleeping on the left side was recorded 447 minutes and associated with an AHI of 52.8/h.  Snoring statistics show a mean volume of 51 dB which is extraordinarily loud, snoring was present for 66% of the total sleep time.  Oxygen Saturation Statistics:   Oxygen Saturation (%) Mean:  The mean oxygen saturation was 89% and this is low.  This varied between a minimum at nadir 67% and a maximum of 99%.                                       O2 saturation in minutes <85% : 62 minutes 15% of total sleep time O2 Saturation (minutes) <89%:   97 minutes (23.4% of total sleep time in O2 desaturation!!!)  O2 saturation in minutes < 90%: 113 minutes or 27% of total sleep time       Pulse Rate Statistics:   Pulse Mean (bpm):      62 bpm  -please note that this cannot extrapolate cardiac rhythm data only the heart rate is reflected here.         Pulse Range: Between 42 and 162 bpm, consistent with bradycardia and tachycardia.               IMPRESSION:  This HST confirms the presence of severe supposedly all obstructive sleep apnea associated with very severe hypoxia.  There is a possibility that the hypoxia could be artifact related, almost persistent hypoxia began after 2 AM during this recording and was seen associated with supine sleep.  It is possible that the pulse ox was not attached correctly throughout the recording.  However the diagnosis of sleep apnea would not be altered by an artefact.   RECOMMENDATION: Given the severity of the sleep apnea ( OSA ) and associated hypoxemia,  I would like for this patient to undergo a in-lab sleep study if insurance permits.  If this is not possible I will order an auto titration  CPAP device at factory settings and have the patient undergo an overnight pulse oximetry while on CPAP within 30 days. Follow-up with the sleep clinic at [facility] after at least 30 days of treatment by CPAP.  An order for an autotitration CPAP device : ResMed S11  with a setting from 5 through 20 cm water pressure , with 2 cm EPR, heated humidification and interface of patient's choice and comfort was issued.    INTERPRETING PHYSICIAN:   Melvyn Novas, MD  [facility] Sleep Board certified by The [employer] of Sleep Medicine and Diplomate of the [employer] of Sleep Medicine. Board certified In Neurology through the ABPN, Fellow of the [employer] of Neurology.

## 2023-03-08 NOTE — Telephone Encounter (Signed)
 Patient would like to proceed with sleep titration study,( order has been placed ) can you let us know if insurance will cover?

## 2023-03-08 NOTE — Progress Notes (Signed)
 Routed message to Katherine Basset and Rosalita Chessman via my chart message on 03/04/23.

## 2023-03-09 ENCOUNTER — Telehealth: Payer: Self-pay | Admitting: Neurology

## 2023-03-09 NOTE — Telephone Encounter (Signed)
 Anthony Potts, CMA  Zott, Kennyth Arnold; Gamble, Tammy New orders have been placed for the above pt, DOB: 2070/07/16 Thanks Zott, Stacy  Nerea Bordenave, Abbe Amsterdam, CMA; Melvern Sample Got It thank you

## 2023-03-09 NOTE — Telephone Encounter (Signed)
 Order already placed for auto. Will send to [facility] once I get the approval from pt.

## 2023-03-09 NOTE — Telephone Encounter (Signed)
 Called patient to discuss sleep study results. No answer at this time. LVM for the patient to call back.  Will send a mychart message

## 2023-03-09 NOTE — Telephone Encounter (Signed)
 BCBS has denied the CPAP because he not tried the APAP therapy.

## 2023-03-16 ENCOUNTER — Ambulatory Visit: Payer: BC Managed Care – PPO | Admitting: Orthopedic Surgery

## 2023-03-16 DIAGNOSIS — S83242D Other tear of medial meniscus, current injury, left knee, subsequent encounter: Secondary | ICD-10-CM

## 2023-03-17 ENCOUNTER — Encounter: Payer: Self-pay | Admitting: Orthopedic Surgery

## 2023-03-17 NOTE — Progress Notes (Signed)
 Office Visit Note   Patient: Anthony Potts           Date of Birth: January 10, 1971           MRN: [id number] Visit Date: 03/16/2023 Requested by: Garnette Gunner, MD [address] [place],  [place] [address] PCP: Garnette Gunner, MD  Subjective: Chief Complaint  Patient presents with   Left Knee - Follow-up    HPI: Anthony Potts is a 53 y.o. male who presents to the office reporting continued left knee pain.  Patient states he has good and bad days.  Cortisone injection last clinic visit helped him 1 to 2 weeks.  Pain does not wake him from sleep.  Patient is very keen on running.  Does have pain with stairs.  MRI scan does show some arthritis in that knee joint along with complex medial meniscal tear..                ROS: All systems reviewed are negative as they relate to the chief complaint within the history of present illness.  Patient denies fevers or chills.  Assessment & Plan: Visit Diagnoses:  1. Acute medial meniscus tear of left knee, subsequent encounter     Plan: Impression is left knee pain with medial meniscal tear in the background of some underlying medial compartment arthritis.  Anthony Potts is very young and active.  He has tried and failed conservative measures for this problem.  Plan at this time would be left knee arthroscopy and meniscal debridement.  I think activity modification coupled with this intervention could give him sustained relief to delay his need for knee replacement farther into the future.  The risk and benefits of left knee arthroscopy and meniscal debridement are discussed including not limited to infection or vessel damage incomplete pain relief as well as incomplete return to function.  I think she will have to consider the risk and benefits before returning to running.  Nonetheless I do think arthroscopic debridement of the meniscus has a reasonable chance of improving but not completely alleviating the pain he is having in that left  knee.  Follow-Up Instructions: No follow-ups on file.   Orders:  No orders of the defined types were placed in this encounter.  No orders of the defined types were placed in this encounter.     Procedures: No procedures performed   Clinical Data: No additional findings.  Objective: Vital Signs: There were no vitals taken for this visit.  Physical Exam:  Constitutional: Patient appears well-developed HEENT:  Head: Normocephalic Eyes:EOM are normal Neck: Normal range of motion Cardiovascular: Normal rate Pulmonary/chest: Effort normal Neurologic: Patient is alert Skin: Skin is warm Psychiatric: Patient has normal mood and affect  Ortho Exam: Ortho exam demonstrates no effusion in the left knee.  Does have some medial joint line tenderness with positive Murray compression testing.  No effusion today.  Collateral crucial ligaments are stable.  No groin pain with internal/external Tatian of the leg.  Pedal pulses palpable.  Specialty Comments:  No specialty comments available.  Imaging: No results found.   PMFS History: Patient Active Problem List   Diagnosis Date Noted   Chronic intermittent hypoxia with obstructive sleep apnea 02/08/2023   Loud snoring 02/08/2023   Primary hypertension 12/14/2022   Nocturia 12/14/2022   Snoring 12/14/2022   Acquired hypothyroidism 12/03/2022   Hyperlipidemia 12/03/2022   Class 3 severe obesity due to excess calories with body mass index (BMI) of 40.0 to 44.9  in adult [facility]) 12/03/2022   Chronic pain of left knee 12/03/2022   Abdominal distention 12/03/2022   Metabolic dysfunction-associated steatotic liver disease (MASLD) 12/03/2022   Past Medical History:  Diagnosis Date   Chronic intermittent hypoxia with obstructive sleep apnea 02/08/2023   Primary hypertension 12/14/2022    Family History  Problem Relation Age of Onset   Diabetes Mother    Cancer Sister     No past surgical history on file. Social History    Occupational History   Not on file  Tobacco Use   Smoking status: Never    Passive exposure: Never   Smokeless tobacco: Never  Vaping Use   Vaping status: Never Used  Substance and Sexual Activity   Alcohol use: Not Currently    Alcohol/week: 1.0 standard drink of alcohol    Types: 1 Cans of beer per week   Drug use: Never   Sexual activity: Yes    Birth control/protection: Condom

## 2023-03-22 DIAGNOSIS — G4733 Obstructive sleep apnea (adult) (pediatric): Secondary | ICD-10-CM | POA: Diagnosis not present

## 2023-04-04 ENCOUNTER — Telehealth: Payer: Self-pay | Admitting: Orthopedic Surgery

## 2023-04-04 NOTE — Telephone Encounter (Signed)
 Called patient to offer surgery dates available for left knee arthroscopy with Dr. August Saucer.  Patient states he would like to hold off for now but did take direct number in case he changes his mind.

## 2023-04-06 DIAGNOSIS — G4733 Obstructive sleep apnea (adult) (pediatric): Secondary | ICD-10-CM | POA: Diagnosis not present

## 2023-04-08 ENCOUNTER — Encounter (INDEPENDENT_AMBULATORY_CARE_PROVIDER_SITE_OTHER): Payer: Self-pay

## 2023-04-18 ENCOUNTER — Other Ambulatory Visit: Payer: Self-pay

## 2023-04-22 DIAGNOSIS — G4733 Obstructive sleep apnea (adult) (pediatric): Secondary | ICD-10-CM | POA: Diagnosis not present

## 2023-05-02 ENCOUNTER — Other Ambulatory Visit: Payer: BC Managed Care – PPO

## 2023-05-03 LAB — TSH(REFL): TSH: 4.13 m[IU]/L (ref 0.40–4.50)

## 2023-05-03 LAB — REFLEX TIQ

## 2023-05-11 ENCOUNTER — Ambulatory Visit: Payer: BC Managed Care – PPO | Admitting: "Endocrinology

## 2023-05-22 DIAGNOSIS — G4733 Obstructive sleep apnea (adult) (pediatric): Secondary | ICD-10-CM | POA: Diagnosis not present

## 2023-06-08 ENCOUNTER — Ambulatory Visit: Admitting: Neurology

## 2023-06-08 ENCOUNTER — Encounter: Payer: Self-pay | Admitting: Neurology

## 2023-06-08 VITALS — BP 138/7 | HR 84 | Ht 71.0 in | Wt 330.8 lb

## 2023-06-08 DIAGNOSIS — G4734 Idiopathic sleep related nonobstructive alveolar hypoventilation: Secondary | ICD-10-CM

## 2023-06-08 DIAGNOSIS — R0683 Snoring: Secondary | ICD-10-CM | POA: Diagnosis not present

## 2023-06-08 DIAGNOSIS — K760 Fatty (change of) liver, not elsewhere classified: Secondary | ICD-10-CM

## 2023-06-08 DIAGNOSIS — G4733 Obstructive sleep apnea (adult) (pediatric): Secondary | ICD-10-CM

## 2023-06-08 NOTE — Patient Instructions (Signed)

## 2023-06-08 NOTE — Progress Notes (Signed)
 SLEEP CLINIC REVISIT:  Provider:  Neomia Banner, MD  Primary Care Physician:  Anthony Cluck, MD [address] [place] [address]     Referring Provider: Catheryn Cluck, Md [address] [place],  [place] [address]          Chief Complaint according to patient   Patient presents with:                HISTORY OF PRESENT ILLNESS:  Anthony Potts is a 53 y.o. male patient who is here for revisit 06/08/2023 for  CPAP first follow up-   Chief concern according to patient :  " I now sleep 4-5 hours straight, less Nocturia , but not more alert or energized than before".  I have a partial tear in my left meniscus , knee arthritis,  and that's interfering with level of activity and exercise. I lost weight last year, abut 50 pounds, and now I gained much of it back ".    Non smoker, rare drinking,  almost no caffeine.  The home sleep test indicated severe sleep apnea the patient slept for almost 7 hours had 20% REM sleep and his overall AHI was 58.2/h that is an apnea per minute of sleep.  There was not a much much of an accentuation in apnea if he slept on his back versus on his side.  He had extraordinary loud snoring 51 dB.  Snoring was present for 66% of the sleep time.  There was also significant hypoxia.  O2 saturation in minutes under 89% was 97 minutes to 1-1/2 hours and the O2 saturation under 90% was 113 minutes.  O2 saturation under 85% which is the critical level of physiologic stress was 62 minutes.  Heart rate varied between 42 and 162 bpm this is tacky and bradycardia.   So therapy by  CPAP was recommended and the patient is on an auto titration ResMed air sense device model 10.  Settings between 5 and 20 cm water pressure with 2 cm EPR I do residual apnea hypopnea index is now 2.2, down from almost 60 he is 87% compliant.    97% by days 87% by hours as he has sometimes fallen short of 4 hours.  The median daily usage on days used is 4  hours 57 minutes.  I like for the patient to continue with CPAP we spoke about the severity of apnea and that this would allow for an attempt to obtain Mounjaro-Zepbound.    This is a weight loss medication that the FDA specifically allows for severe or moderate obstructive sleep apnea patients.     Referral on 02/08/2023 from Dr Anthony Potts for a  sleep consultation Chief concern according to patient :  " I have had surgery for deviated septum in 2007, but this didn't help, I have snored for decades and  I feel not sleepy but am used to 4-5 hours of sleep, I have operated this way for years".  " When I am neither stimulated nor active , I can fall asleep" . 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    I have the pleasure of seeing Anthony Potts on  02/08/23, who is  a right-handed Caucasian  male with a long standing  sleep disorder.      Sleep relevant medical history: Nocturia 0-1 times on CPAP now -   , deviated septum repair, wisdom teeth extracted, 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  HTN, Obesity- accelerated when he weaned off synthroid . Anthony Potts .Family medical Anthony Potts history: no  other family member on CPAP with OSA, sister with insomnia but has been a Charity fundraiser and shift working.    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 Shift work.   and lives in a household alone. Family status is single, with 2 children twins age 67 . Pets are not present. Tobacco use: none .  ETOH use ; social- 1-2/ months. Caffeine intake in form of Coffee( /) Soda( /) Tea ( /) no  energy drinks  Review of Systems: Out of a complete 14 system review, the patient complains of only the following symptoms, and all other reviewed systems are negative.:   Social History   Socioeconomic History   Marital status: Single    Spouse name: Not on file   Number of children: 2   Years of education: Not on  file   Highest education level: Bachelor's degree (e.g., BA, AB, BS)  Occupational History   Not on file  Tobacco Use   Smoking status: Never    Passive exposure: Never   Smokeless tobacco: Never  Vaping Use   Vaping status: Never Used  Substance and Sexual Activity   Alcohol use: Not Currently    Alcohol/week: 1.0 standard drink of alcohol    Types: 1 Cans of beer per week    Comment: occasionally   Drug use: Never   Sexual activity: Yes    Birth control/protection: Condom  Other Topics Concern   Not on file  Social History Narrative   Right handed    Wears reader glasses    Drinks fanta orange ( occ)   Social Drivers of Health   Financial Resource Strain: Low Risk  (12/14/2022)   Overall Financial Resource Strain (CARDIA)    Difficulty of Paying Living Expenses: Not hard at all  Food Insecurity: No Food Insecurity (12/14/2022)   Hunger Vital Sign    Worried About Running Out of Food in the Last Year: Never true    Ran Out of Food in the Last Year: Never true  Transportation Needs: No Transportation Needs (12/14/2022)   PRAPARE - Administrator, Civil Service (Medical): No    Lack of Transportation (Non-Medical): No  Physical Activity: Sufficiently Active (12/14/2022)   Exercise Vital Sign    Days of Exercise per Week: 4 days    Minutes of Exercise per Session: 120 min  Stress: No Stress Concern Present (12/14/2022)   [employer] of Occupational Health - Occupational Stress Questionnaire    Feeling of Stress : Not at all  Social Connections: Moderately Integrated (12/14/2022)   Social Connection and Isolation Panel [NHANES]    Frequency of Communication with Friends and Family: More than three times a week    Frequency of Social Gatherings with Friends and Family: More than three times a week    Attends Religious Services: More than 4 times per year    Active Member of [employer] or Organizations: Yes    Attends Engineer, structural: More than 4  times per year    Marital Status: Never married    Family History  Problem Relation Age of Onset   Diabetes Mother    Cancer Sister     Past Medical History:  Diagnosis Date   Chronic intermittent hypoxia with  obstructive sleep apnea 02/08/2023   Primary hypertension 12/14/2022    History reviewed. No pertinent surgical history.   Current Outpatient Medications on File Prior to Visit  Medication Sig Dispense Refill   levothyroxine  (SYNTHROID ) 75 MCG tablet Take 1 tablet (75 mcg total) by mouth daily. 90 tablet 1   No current facility-administered medications on file prior to visit.    No Known Allergies   DIAGNOSTIC DATA (LABS, IMAGING, TESTING) - I reviewed patient records, labs, notes, testing and imaging myself where available.  Lab Results  Component Value Date   WBC 5.9 12/14/2022   HGB 16.7 12/14/2022   HCT 49.4 12/14/2022   MCV 94.2 12/14/2022   PLT 185.0 12/14/2022      Component Value Date/Time   NA 140 12/14/2022 0843   K 4.4 12/14/2022 0843   CL 106 12/14/2022 0843   CO2 30 12/14/2022 0843   GLUCOSE 97 12/14/2022 0843   BUN 17 12/14/2022 0843   CREATININE 0.81 12/14/2022 0843   CALCIUM 8.9 12/14/2022 0843   PROT 6.9 12/14/2022 0843   ALBUMIN 4.0 12/14/2022 0843   AST 35 12/14/2022 0843   ALT 47 12/14/2022 0843   ALKPHOS 61 12/14/2022 0843   BILITOT 0.5 12/14/2022 0843   Lab Results  Component Value Date   CHOL 195 12/14/2022   HDL 36.90 (L) 12/14/2022   LDLCALC 133 (H) 12/14/2022   TRIG 125.0 12/14/2022   CHOLHDL 5 12/14/2022   Lab Results  Component Value Date   HGBA1C 5.9 12/14/2022   No results found for: "VITAMINB12" Lab Results  Component Value Date   TSH 10.04 (H) 02/02/2023    PHYSICAL EXAM:  Today's Vitals   06/08/23 1012  BP: (!) 138/7  Pulse: 84  Weight: (!) 330 lb 12.8 oz (150 kg)  Height: 5\' 11"  (1.803 m)   Body mass index is 46.14 kg/m.   Wt Readings from Last 3 Encounters:  06/08/23 (!) 330 lb 12.8 oz (150  kg)  02/08/23 (!) 313 lb 9.6 oz (142.2 kg)  02/08/23 (!) 314 lb (142.4 kg)     Ht Readings from Last 3 Encounters:  06/08/23 5\' 11"  (1.803 m)  02/08/23 5' 10.5" (1.791 m)  02/08/23 5' 10.5" (1.791 m)      General: The patient is awake, alert and appears not in acute distress. The patient is well groomed.  Has chewed through night guards, bruxism.  Head: Normocephalic, atraumatic. Neck is supple. Mallampati 2-3 ,  neck circumference:21 inches .  Nasal airflow is  patent.  Retrognathia is not seen.  Dental status: intact  Cardiovascular:  Regular rate and cardiac rhythm by pulse,  without distended neck veins. Respiratory: Lungs are clear to auscultation.  Skin:  Without evidence of ankle edema, or rash. Trunk: The patient's posture is erect.   NEUROLOGIC EXAM: The patient is awake and alert, oriented to place and time.   Memory subjective described as intact.  Attention span & concentration ability appears normal.  Speech is fluent,  without  dysarthria, dysphonia or aphasia.  Mood and affect are appropriate.   Cranial nerves: no loss of smell or taste reported  Pupils are equal and briskly reactive to light. Funduscopic exam deferred. .  Extraocular movements in vertical and horizontal planes were intact and without nystagmus. No Diplopia. Visual fields by finger perimetry are intact. Hearing was intact to soft voice and finger rubbing.    Facial sensation intact to fine touch.  Facial motor strength is symmetric and tongue and  uvula move midline.  Neck ROM : rotation, tilt and flexion extension were normal for age and shoulder shrug was symmetrical.    Motor exam:  Symmetric bulk, tone and ROM.   Normal tone without cog wheeling, symmetric grip strength .   Deep tendon reflexes: in the  upper and lower extremities are symmetric and intact.      ASSESSMENT AND PLAN 53 y.o. year old male  here with:    1) severe OSA and accompanied by severe sleep hypoxia. On auto set  CPAP 5-20 with 95% pressure of 10 cm water.  the median daily usage on days used is 4 hours 57 minutes.    Plan:  I like for the patient to continue with CPAP  which he has gotten used to and likes, less nocturia.  Snoring is much reduced.   He may consider a travel CPAP, this would be set to his 95% pressure. I recommend to by it from the same maker as the main machine (ResMed ) thus to be able to combine the compliance reports   We spoke about the severity of apnea and that this would allow for an attempt to obtain Mounjaro-Zepbound.    This is a weight loss medication that the FDA specifically allows for severe or moderate obstructive sleep apnea patients.  Rv in 12 months unless needed earlier- in any case, after a 10% weight loss can already reduce AHI significantly, we usually retest at 20% weight loss.     I would like to thank Anthony Cluck, MD and Anthony Cluck, Md [address] [place],  [place] [address] for allowing me to meet with and to take care of this pleasant patient.    After spending a total time of  35  minutes face to face and additional time for physical and neurologic examination, review of laboratory studies,  personal review of imaging studies, reports and results of other testing and review of referral information / records as far as provided in visit,   Electronically signed by: Anthony Banner, MD 06/08/2023 10:51 AM  [facility] Sleep Board certified by The [employer] of Sleep Medicine and Diplomate of the [employer] of Sleep Medicine. Board certified In Neurology through the ABPN, Fellow of the [employer] of Neurology.

## 2023-06-22 DIAGNOSIS — G4733 Obstructive sleep apnea (adult) (pediatric): Secondary | ICD-10-CM | POA: Diagnosis not present

## 2023-06-29 DIAGNOSIS — G4733 Obstructive sleep apnea (adult) (pediatric): Secondary | ICD-10-CM | POA: Diagnosis not present

## 2023-07-20 ENCOUNTER — Telehealth: Payer: Self-pay | Admitting: "Endocrinology

## 2023-07-20 NOTE — Telephone Encounter (Signed)
 Patient called asking for a transfer of care.He currently sees Dr.Motwani and wants to start seeing Dr.Shamleffer.

## 2023-07-22 DIAGNOSIS — G4733 Obstructive sleep apnea (adult) (pediatric): Secondary | ICD-10-CM | POA: Diagnosis not present

## 2023-08-06 ENCOUNTER — Other Ambulatory Visit: Payer: Self-pay | Admitting: "Endocrinology

## 2023-08-09 NOTE — Telephone Encounter (Signed)
 Requested Prescriptions     Pending Prescriptions Disp Refills    levothyroxine (SYNTHROID) 75 MCG tablet [Pharmacy Med Name: LEVOTHYROXINE 75 MCG TABLET] 30 tablet 5     Sig: TAKE 1 TABLET BY MOUTH EVERY DAY

## 2023-08-17 ENCOUNTER — Encounter: Payer: Self-pay | Admitting: Internal Medicine

## 2023-08-17 ENCOUNTER — Ambulatory Visit: Admitting: Internal Medicine

## 2023-08-17 VITALS — BP 130/70 | HR 68 | Ht 71.0 in | Wt 332.0 lb

## 2023-08-17 DIAGNOSIS — E66813 Obesity, class 3: Secondary | ICD-10-CM

## 2023-08-17 DIAGNOSIS — E042 Nontoxic multinodular goiter: Secondary | ICD-10-CM | POA: Diagnosis not present

## 2023-08-17 DIAGNOSIS — E039 Hypothyroidism, unspecified: Secondary | ICD-10-CM

## 2023-08-17 DIAGNOSIS — Z6841 Body Mass Index (BMI) 40.0 and over, adult: Secondary | ICD-10-CM

## 2023-08-17 MED ORDER — TIRZEPATIDE-WEIGHT MANAGEMENT 2.5 MG/0.5ML ~~LOC~~ SOLN
2.5000 mg | SUBCUTANEOUS | 3 refills | Status: DC
Start: 2023-08-17 — End: 2023-09-15

## 2023-08-17 NOTE — Progress Notes (Unsigned)
 Name: Anthony Potts  MRN/ DOB: [id number], Jun 19, 1970    Age/ Sex: 53 y.o., male     PCP: Sebastian Beverley NOVAK, MD   Reason for Endocrinology Evaluation: Hypothyroidism     Initial Endocrinology Clinic Visit: 02/08/2023    PATIENT IDENTIFIER: Anthony Potts is a 53 y.o., male with a past medical history of abdominal/inguinal hernia, multinodular goiter. He has followed with [facility] since 02/08/2023 for consultative assistance with management of his hypothyriodism.   HISTORICAL SUMMARY: The patient was first diagnosed with thyroid  disease in 2001.  A few years later he was diagnosed with hyperthyroidism, requiring oral medication (does not recall the name ), he was eventually diagnosed with multinodular goiter in 2005 .  At that time he did have thyroid  uptake and scan with a low-normal 24-hour radioactive iodine uptake at 12.3% .  Thyroid  ultrasound in 2004 confirmed the presence of multinodular goiter.  noted with elevated TSH at 11.17 u IU/mL in December, 2024  Transferred care from Dr. Dartha by  08/2023   Sister with thyrdoid disease  SUBJECTIVE:    Today (08/17/2023):  Anthony Potts is here for a follow up on Hypothyroidism.   Patient has been noted with weight gain since his last visit to our clinic No local neck swelling  Denies eye symptoms Denies palpitations  Denies tremors  Denies constipation or diarrhea  Started CPAP 03/2023    Levothyroxine  75 mcg daily  HISTORY:  Past Medical History:  Past Medical History:  Diagnosis Date   Chronic intermittent hypoxia with obstructive sleep apnea 02/08/2023   Primary hypertension 12/14/2022   Past Surgical History: No past surgical history on file. Social History:  reports that he has never smoked. He has never been exposed to tobacco smoke. He has never used smokeless tobacco. He reports that he does not currently use alcohol after a past usage of about 1.0 standard drink of alcohol per week. He  reports that he does not use drugs. Family History:  Family History  Problem Relation Age of Onset   Diabetes Mother    Cancer Sister      HOME MEDICATIONS: Allergies as of 08/17/2023   No Known Allergies      Medication List        Accurate as of August 17, 2023  9:04 AM. If you have any questions, ask your nurse or doctor.          levothyroxine  75 MCG tablet Commonly known as: SYNTHROID  TAKE 1 TABLET BY MOUTH EVERY DAY          OBJECTIVE:   PHYSICAL EXAM: VS: BP 130/70 (BP Location: Right Arm, Patient Position: Sitting, Cuff Size: Normal)   Pulse 68   Ht 5' 11 (1.803 m)   Wt (!) 332 lb (150.6 kg)   SpO2 96%   BMI 46.30 kg/m      Filed Weights   08/17/23 0856  Weight: (!) 332 lb (150.6 kg)    EXAM: General: Pt appears well and is in NAD  Neck: General: Supple without adenopathy. Thyroid : Right thyroid  asymmetry noted on exam today.   Lungs: Clear with good BS bilat   Heart: Auscultation: RRR.  Abdomen: soft, nontender  Extremities:  BL LE: No pretibial edema  Mental Status: Judgment, insight: Intact Orientation: Oriented to time, place, and person Mood and affect: No depression, anxiety, or agitation     DATA REVIEWED:  Latest Reference Range & Units 08/17/23 09:38  TSH 0.40 - 4.50 mIU/L  6.18 (H)  T4,Free(Direct) 0.8 - 1.8 ng/dL 0.9       Thyroid  uptake and scan 1//2005  Following oral ingestion of 13.6 uCi of iodine-131, a 24-hour radioactive iodine uptake of 12.3% was obtained (normal 10-35%). Following the IV injection of 10.0 mCi of pertechnetate, images were obtained which showed an inhomogeneous gland with an appearance most consistent with a multinodular gland. When correlating the appearance with the report from [facility] on 08/28/02, and from the patient's thyroid  ultrasound, the findings would correspond to the appearance on the ultrasound study.  IMPRESSION:   Low-normal 24-hour radioactive iodine uptake  at 12.3%. The findings are most consistent with a multinodular goiter, as discussed above.       ASSESSMENT / PLAN / RECOMMENDATIONS:   Hypothyroidism  - Patient is clinically euthyroid - No local neck symptoms -- Pt educated extensively on the correct way to take levothyroxine  (first thing in the morning with water, 30 minutes before eating or taking other medications). - Pt encouraged to double dose the following day if she were to miss a dose given long half-life of levothyroxine . - TSH is elevated, will increase levothyroxine  as below - TPO antibodies pending  Medications  Stop levothyroxine  75 mcg Start levothyroxine  100 mcg daily  2. Multinodular goiter:   - No local neck symptoms - Patient was noted with multinodular goiter in 2005, no follow-up has been done - Will proceed with thyroid  ultrasound    3. Obesity :  -Discussed the importance of diet and exercise - Patient interested in weight loss management - Discussed GLP-1 agonist, caution against GI side effects - Patient will contact office in 6-8 weeks , if no side effects to increase the dose  Medication  Start Zepbound  2.5 mg weekly    Follow-up in 4 months    Signed electronically by: Stefano Redgie Butts, MD  [facility]  [facility] [address] [place] [place], [place] [address] Phone: [phone] FAX: [phone]      CC: Sebastian Beverley NOVAK, MD [address] [place] [address] Phone: [phone]  Fax: [phone]   Return to Endocrinology clinic as below: Future Appointments  Date Time Provider Department Center  06/06/2024 11:15 AM Whitfield Raisin, NP [facility] None

## 2023-08-17 NOTE — Patient Instructions (Signed)

## 2023-08-18 ENCOUNTER — Telehealth: Payer: Self-pay

## 2023-08-18 ENCOUNTER — Other Ambulatory Visit (HOSPITAL_COMMUNITY): Payer: Self-pay

## 2023-08-18 ENCOUNTER — Ambulatory Visit: Payer: Self-pay | Admitting: Internal Medicine

## 2023-08-18 ENCOUNTER — Ambulatory Visit
Admission: RE | Admit: 2023-08-18 | Discharge: 2023-08-18 | Disposition: A | Discharge status: Home / Self Care | Source: Ambulatory Visit | Attending: Internal Medicine | Admitting: Internal Medicine

## 2023-08-18 DIAGNOSIS — E042 Nontoxic multinodular goiter: Secondary | ICD-10-CM

## 2023-08-18 DIAGNOSIS — E669 Obesity, unspecified: Secondary | ICD-10-CM | POA: Insufficient documentation

## 2023-08-18 MED ORDER — LEVOTHYROXINE SODIUM 100 MCG PO TABS: 100.0000 ug | ORAL_TABLET | Freq: Every day | ORAL | 3 refills | Status: AC

## 2023-08-18 NOTE — Telephone Encounter (Signed)
 Pharmacy Patient Advocate Encounter   Received notification from CoverMyMeds that prior authorization for Zepbound  2.5MG /0.5ML pen-injectors is required/requested.   Insurance verification completed.   The patient is insured through [employer] .   Per test claim: PA required and submitted KEY/EOC/Request #: [id number] from 07/19/23 to 04/14/24  Case ID: [id number]

## 2023-08-22 DIAGNOSIS — G4733 Obstructive sleep apnea (adult) (pediatric): Secondary | ICD-10-CM | POA: Diagnosis not present

## 2023-08-22 LAB — THYROID PEROXIDASE ANTIBODY: Thyroperoxidase Ab SerPl-aCnc: 25 [IU]/mL — ABNORMAL HIGH (ref <9)

## 2023-08-22 LAB — T4, FREE: Free T4: 0.9 ng/dL (ref 0.8–1.8)

## 2023-08-22 LAB — TSH: TSH: 6.18 m[IU]/L — ABNORMAL HIGH (ref 0.40–4.50)

## 2023-09-05 ENCOUNTER — Other Ambulatory Visit (HOSPITAL_COMMUNITY)
Admission: RE | Admit: 2023-09-05 | Discharge: 2023-09-05 | Disposition: A | Discharge status: Home / Self Care | Source: Ambulatory Visit | Attending: Radiology | Admitting: Radiology

## 2023-09-05 ENCOUNTER — Ambulatory Visit
Admission: RE | Admit: 2023-09-05 | Discharge: 2023-09-05 | Disposition: A | Discharge status: Home / Self Care | Source: Ambulatory Visit | Attending: Internal Medicine | Admitting: Internal Medicine

## 2023-09-05 DIAGNOSIS — E042 Nontoxic multinodular goiter: Secondary | ICD-10-CM | POA: Diagnosis not present

## 2023-09-05 DIAGNOSIS — E041 Nontoxic single thyroid nodule: Secondary | ICD-10-CM | POA: Diagnosis not present

## 2023-09-07 ENCOUNTER — Ambulatory Visit: Payer: Self-pay | Admitting: Internal Medicine

## 2023-09-07 LAB — CYTOLOGY - NON PAP

## 2023-09-14 ENCOUNTER — Encounter: Payer: Self-pay | Admitting: Internal Medicine

## 2023-09-15 MED ORDER — TIRZEPATIDE 5 MG/0.5ML ~~LOC~~ SOAJ
5.0000 mg | SUBCUTANEOUS | 3 refills | Status: DC
Start: 2023-09-15 — End: 2023-09-20

## 2023-09-20 ENCOUNTER — Telehealth: Payer: Self-pay

## 2023-09-20 ENCOUNTER — Other Ambulatory Visit (HOSPITAL_COMMUNITY): Payer: Self-pay

## 2023-09-20 MED ORDER — ZEPBOUND 5 MG/0.5ML ~~LOC~~ SOAJ: 5.0000 mg | SUBCUTANEOUS | 3 refills | Status: DC

## 2023-09-20 NOTE — Telephone Encounter (Signed)
 Pharmacy Patient Advocate Encounter   Received notification from Pt Calls Messages that prior authorization for Mounjaro  is required/requested.   Insurance verification completed.   The patient is insured through [employer] .   Mounjaro  is approved exclusively as an adjunct to diet and exercise to improve glycemic  control in adults with type 2 diabetes mellitus. A review of patient's medical chart reveals no  documented diagnosis of type 2 diabetes or an A1C indicative of diabetes. Therefore, they do not  currently meet the criteria for prior authorization of this medication.  Pt was previously approved for Zepbound  on 08/18/23. Test claim for the dose increase for Zepbound  5mg /0.52ml is successful. 30 day copay is $25.  Pa expires 04/14/2024.

## 2023-09-20 NOTE — Telephone Encounter (Signed)
 Mounjaro needs PA

## 2023-09-20 NOTE — Addendum Note (Signed)
 Addended by: SAM DONELL PARAS on: 09/20/2023 01:04 PM   Modules accepted: Orders

## 2023-09-22 DIAGNOSIS — G4733 Obstructive sleep apnea (adult) (pediatric): Secondary | ICD-10-CM | POA: Diagnosis not present

## 2023-11-14 ENCOUNTER — Encounter: Payer: Self-pay | Admitting: Radiology

## 2023-11-21 ENCOUNTER — Other Ambulatory Visit: Payer: Self-pay

## 2023-11-21 ENCOUNTER — Encounter (HOSPITAL_COMMUNITY): Payer: Self-pay | Admitting: *Deleted

## 2023-11-21 ENCOUNTER — Emergency Department (HOSPITAL_COMMUNITY)
Admission: EM | Admit: 2023-11-21 | Discharge: 2023-11-21 | Disposition: A | Discharge status: Home / Self Care | Attending: Emergency Medicine | Admitting: Emergency Medicine

## 2023-11-21 DIAGNOSIS — Z79890 Hormone replacement therapy (postmenopausal): Secondary | ICD-10-CM | POA: Insufficient documentation

## 2023-11-21 DIAGNOSIS — M79604 Pain in right leg: Secondary | ICD-10-CM | POA: Diagnosis not present

## 2023-11-21 DIAGNOSIS — L03115 Cellulitis of right lower limb: Secondary | ICD-10-CM | POA: Diagnosis not present

## 2023-11-21 LAB — COMPREHENSIVE METABOLIC PANEL WITH GFR
ALT: 67 U/L — ABNORMAL HIGH (ref 0–44)
AST: 46 U/L — ABNORMAL HIGH (ref 15–41)
Albumin: 4.3 g/dL (ref 3.5–5.0)
Alkaline Phosphatase: 81 U/L (ref 38–126)
Anion gap: 11 (ref 5–15)
BUN: 15 mg/dL (ref 6–20)
CO2: 23 mmol/L (ref 22–32)
Calcium: 9.7 mg/dL (ref 8.9–10.3)
Chloride: 106 mmol/L (ref 98–111)
Creatinine, Ser: 1.15 mg/dL (ref 0.61–1.24)
GFR, Estimated: >60 mL/min (ref >60)
Glucose, Bld: 102 mg/dL — ABNORMAL HIGH (ref 70–99)
Potassium: 4.6 mmol/L (ref 3.5–5.1)
Sodium: 140 mmol/L (ref 135–145)
Total Bilirubin: 1.5 mg/dL — ABNORMAL HIGH (ref 0.0–1.2)
Total Protein: 7.8 g/dL (ref 6.5–8.1)

## 2023-11-21 LAB — CBC WITH DIFFERENTIAL/PLATELET
Abs Immature Granulocytes: 0.03 K/uL (ref 0.00–0.07)
Basophils Absolute: 0.1 K/uL (ref 0.0–0.1)
Basophils Relative: 1 %
Eosinophils Absolute: 0 K/uL (ref 0.0–0.5)
Eosinophils Relative: 0 %
HCT: 48.1 % (ref 39.0–52.0)
Hemoglobin: 16.2 g/dL (ref 13.0–17.0)
Immature Granulocytes: 0 %
Lymphocytes Relative: 17 %
Lymphs Abs: 1.8 K/uL (ref 0.7–4.0)
MCH: 31.2 pg (ref 26.0–34.0)
MCHC: 33.7 g/dL (ref 30.0–36.0)
MCV: 92.7 fL (ref 80.0–100.0)
Monocytes Absolute: 1.4 K/uL — ABNORMAL HIGH (ref 0.1–1.0)
Monocytes Relative: 13 %
Neutro Abs: 7.6 K/uL (ref 1.7–7.7)
Neutrophils Relative %: 69 %
Platelets: 166 K/uL (ref 150–400)
RBC: 5.19 MIL/uL (ref 4.22–5.81)
RDW: 13.7 % (ref 11.5–15.5)
WBC: 11 K/uL — ABNORMAL HIGH (ref 4.0–10.5)
nRBC: 0 % (ref 0.0–0.2)

## 2023-11-21 LAB — I-STAT CG4 LACTIC ACID, ED: Lactic Acid, Venous: 0.7 mmol/L (ref 0.5–1.9)

## 2023-11-21 MED ORDER — DOXYCYCLINE HYCLATE 100 MG PO CAPS: 100.0000 mg | ORAL_CAPSULE | Freq: Two times a day (BID) | ORAL | 0 refills | Status: DC

## 2023-11-21 MED ORDER — DOXYCYCLINE HYCLATE 100 MG PO TABS
100.0000 mg | ORAL_TABLET | Freq: Once | ORAL | Status: AC
  Administered 2023-11-21: 100 mg via ORAL
  Filled 2023-11-21: qty 1

## 2023-11-21 NOTE — ED Triage Notes (Signed)
 Pt having right leg pain on Saturday, worsening last night with upset stomach/NVD. Today, having chills, fatigue, no appetite; took ibuprofen around 3pm,

## 2023-11-21 NOTE — Discharge Instructions (Signed)
 Please return for rapid spreading redness or if you start feeling worse.

## 2023-11-21 NOTE — ED Provider Notes (Signed)
 Anthony Potts Provider Note   CSN: [id number] Arrival date & time: 11/21/23  8040     Patient presents with: No chief complaint on file.   Anthony Potts is a 53 y.o. male.   53 yo M with a cc of R leg erythema pain and swelling. Going on for a day or so.  Subjectively hot and cold.         Prior to Admission medications   Medication Sig Start Date End Date Taking? Authorizing Provider  doxycycline (VIBRAMYCIN) 100 MG capsule Take 1 capsule (100 mg total) by mouth 2 (two) times daily. One po bid x 7 days 11/21/23  Yes Emil Share, DO  levothyroxine  (SYNTHROID ) 100 MCG tablet Take 1 tablet (100 mcg total) by mouth daily. 08/18/23   Shamleffer, Ibtehal Jaralla, MD  tirzepatide  (ZEPBOUND ) 5 MG/0.5ML Pen Inject 5 mg into the skin once a week. 09/20/23   Shamleffer, Ibtehal Jaralla, MD    Allergies: Patient has no known allergies.    Review of Systems  Updated Vital Signs BP 131/80 (BP Location: Right Arm)   Pulse 72   Temp 97.7 F (36.5 C) (Oral)   Resp 16   SpO2 94%   Physical Exam Vitals and nursing note reviewed.  Constitutional:      Appearance: He is well-developed.  HENT:     Head: Normocephalic and atraumatic.  Eyes:     Pupils: Pupils are equal, round, and reactive to light.  Neck:     Vascular: No JVD.  Cardiovascular:     Rate and Rhythm: Normal rate and regular rhythm.     Heart sounds: No murmur heard.    No friction rub. No gallop.  Pulmonary:     Effort: No respiratory distress.     Breath sounds: No wheezing.  Abdominal:     General: There is no distension.     Tenderness: There is no abdominal tenderness. There is no guarding or rebound.  Musculoskeletal:        General: Normal range of motion.     Cervical back: Normal range of motion and neck supple.  Skin:    Coloration: Skin is not pale.     Findings: No rash.     Comments: Erythema and edema to the R leg.  Intact pulses distally.  No obvious area  of fluctuance or induration.  Neurological:     Mental Status: He is alert and oriented to person, place, and time.  Psychiatric:        Behavior: Behavior normal.     (all labs ordered are listed, but only abnormal results are displayed) Labs Reviewed  COMPREHENSIVE METABOLIC PANEL WITH GFR - Abnormal; Notable for the following components:      Result Value   Glucose, Bld 102 (*)    AST 46 (*)    ALT 67 (*)    Total Bilirubin 1.5 (*)    All other components within normal limits  CBC WITH DIFFERENTIAL/PLATELET - Abnormal; Notable for the following components:   WBC 11.0 (*)    Monocytes Absolute 1.4 (*)    All other components within normal limits  I-STAT CG4 LACTIC ACID, ED    EKG: None  Radiology: No results found.   Procedures   Medications Ordered in the ED  doxycycline (VIBRA-TABS) tablet 100 mg (has no administration in time range)  Medical Decision Making Amount and/or Complexity of Data Reviewed Labs: ordered.  Risk Prescription drug management.   53 yo M with a chief complaints of right leg redness and swelling.  Going on for a couple days.  Likely the patient was cellulitis.  Tells me he has a history of the same.  Will start on antibiotics.  PCP follow-up.  Mild leukocytosis.  Subjectively febrile at home.  No obvious fever here.  No significant electrolyte abnormalities.  10:00 PM:  I have discussed the diagnosis/risks/treatment options with the patient.  Evaluation and diagnostic testing in the emergency department does not suggest an emergent condition requiring admission or immediate intervention beyond what has been performed at this time.  They will follow up with PCP. We also discussed returning to the ED immediately if new or worsening sx occur. We discussed the sx which are most concerning (e.g., sudden worsening pain, fever, inability to tolerate by mouth) that necessitate immediate return. Medications  administered to the patient during their visit and any new prescriptions provided to the patient are listed below.  Medications given during this visit Medications  doxycycline (VIBRA-TABS) tablet 100 mg (has no administration in time range)     The patient appears reasonably screen and/or stabilized for discharge and I doubt any other medical condition or other [facility] requiring further screening, evaluation, or treatment in the ED at this time prior to discharge.       Final diagnoses:  Cellulitis of right lower extremity    ED Discharge Orders          Ordered    doxycycline (VIBRAMYCIN) 100 MG capsule  2 times daily        11/21/23 2147               Jillann Charette, DO 11/21/23 2200

## 2023-11-22 DIAGNOSIS — G4733 Obstructive sleep apnea (adult) (pediatric): Secondary | ICD-10-CM | POA: Diagnosis not present

## 2023-11-28 ENCOUNTER — Ambulatory Visit: Payer: Self-pay | Admitting: *Deleted

## 2023-11-28 ENCOUNTER — Ambulatory Visit: Payer: Self-pay | Admitting: Emergency Medicine

## 2023-11-28 ENCOUNTER — Encounter: Payer: Self-pay | Admitting: Emergency Medicine

## 2023-11-28 ENCOUNTER — Ambulatory Visit: Admitting: Emergency Medicine

## 2023-11-28 ENCOUNTER — Ambulatory Visit (HOSPITAL_COMMUNITY)
Admission: RE | Admit: 2023-11-28 | Discharge: 2023-11-28 | Disposition: A | Discharge status: Home / Self Care | Source: Ambulatory Visit | Attending: Surgery | Admitting: Surgery

## 2023-11-28 VITALS — BP 143/78 | HR 62 | Temp 98.1°F | Resp 16 | Ht 71.0 in | Wt 306.0 lb

## 2023-11-28 DIAGNOSIS — M7989 Other specified soft tissue disorders: Secondary | ICD-10-CM

## 2023-11-28 DIAGNOSIS — J329 Chronic sinusitis, unspecified: Secondary | ICD-10-CM

## 2023-11-28 DIAGNOSIS — L03115 Cellulitis of right lower limb: Secondary | ICD-10-CM

## 2023-11-28 MED ORDER — CLINDAMYCIN HCL 150 MG PO CAPS: 450.0000 mg | ORAL_CAPSULE | Freq: Three times a day (TID) | ORAL | 0 refills | Status: AC

## 2023-11-28 NOTE — Telephone Encounter (Signed)
 FYI Only or Action Required?: FYI only for provider: appointment scheduled on 11/17.  Patient was last seen in primary care on 12/14/2022 by Anthony Beverley NOVAK, MD.  Called Nurse Triage reporting Leg Swelling.  Symptoms began a week ago.  Interventions attempted: ED visit- doxycycline (VIBRAMYCIN) 100 MG capsule  Symptoms are: Patient reports improvement- but not completely cleared.  Triage Disposition: See Physician Within 24 Hours  Patient/caregiver understands and will follow disposition?: Yes  Copied from CRM #[id number]. Topic: Clinical - Red Word Triage >> Nov 28, 2023  8:50 AM Pinkey ORN wrote: Red Word that prompted transfer to Nurse Triage: Swelling + Tingling Feeling >> Nov 28, 2023  8:52 AM Pinkey ORN wrote: Patient is experiencing some swelling + tingling sensation in his lower right leg, starting from the knee down to his ankle. Patient states it's warm to touch and his skin is kind of spongy.   Reason for Disposition . [1] MODERATE leg swelling (e.g., swelling extends up to knees) AND [2] new-onset or getting worse  Answer Assessment - Initial Assessment Questions 1. ONSET: When did the swelling start? (e.g., minutes, hours, days)     Last Sunday night- was seen at ED- diagnosed with cellulitis- Doxy prescribed 2. LOCATION: What part of the leg is swollen?  Are both legs swollen or just one leg?     Lower right-Swollen, warm to touch, skin feels spongy 3. SEVERITY: How bad is the swelling? (e.g., localized; mild, moderate, severe)     Lower right leg- mid calf to top of foot-doulble size 4. REDNESS: Is there redness or signs of infection?     Bluish , red at ankle, red , warm in calve area 5. PAIN: Is the swelling painful to touch? If Yes, ask: How painful is it?   (Scale 1-10; mild, moderate or severe)     No pain- more discomfort 6. FEVER: Do you have a fever? If Yes, ask: What is it, how was it measured, and when did it start?      no 7. CAUSE:  What do you think is causing the leg swelling?     Hx  recent cellulitis 8. MEDICAL HISTORY: Do you have a history of blood clots (e.g., DVT), cancer, heart failure, kidney disease, or liver failure?     no 9. RECURRENT SYMPTOM: Have you had leg swelling before? If Yes, ask: When was the last time? What happened that time?     no 10. OTHER SYMPTOMS: Do you have any other symptoms? (e.g., chest pain, difficulty breathing)       Request referral to ENT- cough  Protocols used: Leg Swelling and Edema-A-AH

## 2023-11-28 NOTE — Telephone Encounter (Signed)
 Copied from CRM #[id number]. Topic: Referral - Question >> Nov 28, 2023  8:52 AM Pinkey ORN wrote: Reason for CRM: Referral Question >> Nov 28, 2023  8:54 AM Pinkey ORN wrote: Patient is wanting to know if he could be referred over to a ENT specialist. Patient states he's having some sinus issues. Please follow up with patient.   Pt states he will discuss sinus issue at today's appointment.

## 2023-11-28 NOTE — Progress Notes (Signed)
 Assessment & Plan:   Assessment & Plan Leg swelling While there was some improvement in symptoms with a week of doxycycline, it looks only been about 30% improvement and I am concerned about the persistent edema to the extremity.  Therefore, I recommend a venous Doppler ultrasound to rule out associated DVT and if this is negative we could proceed with an additional round of antibiotics for cellulitis coverage Orders:   VAS US  LOWER EXTREMITY VENOUS (DVT); Future  Chronic congestion of paranasal sinus        Corean Geralds, MSPAS, PA-C    Subjective:  Leg Swelling (Patient is experiencing some swelling + tingling sensation in his lower right leg, starting from the knee down to his ankle. Patient states it's warm to touch and his skin is kind of spongy. Recent dx of Cellulitis. Finished Doxycycline last night. ) and Nasal Congestion (Congestion and cough x 1 week. Has been taking Mucinex. )    HPI: Anthony Potts is a 53 y.o. male presenting on 11/28/2023  On tirzepatide  for obesity, no DM history.  Seen in ED 11/10, slight leukocytosis WBC 11k, was rx doxy for RLL cellulitis.  He completed the antibiotic and while her symptoms did improve, the leg is still swollen and red and feels spongy to touch.  It is painful to touch, but has not otherwise limited his activities.  He has not run any fevers since he was seen in the emergency department.  He drives longer distances frequently, including to and from [facility] in the last 7 days.  He does not smoke. Dark urine that he attributed to doxycycline, as today with no abx on board it is clear again.  He has a known history of fatty liver disease.  Some mild LFT elevation in the emergency department.  Denies any alcohol consumption in the last 7 days.  He also denies any acute abdominal pain, vomiting, or bowel habit change. Also for about a week some head and chest congestion.  Similar to something he gets multiple times a year  and he does not feel he has a sinus infection nor does he have any chest pain or feel shortness of breath.   ROS: Negative unless specifically indicated above in HPI.   Relevant past medical history reviewed and updated as indicated.   Allergies and medications reviewed and updated.   Current Outpatient Medications:    levothyroxine  (SYNTHROID ) 100 MCG tablet, Take 1 tablet (100 mcg total) by mouth daily., Disp: 90 tablet, Rfl: 3   tirzepatide  (ZEPBOUND ) 5 MG/0.5ML Pen, Inject 5 mg into the skin once a week., Disp: 6 mL, Rfl: 3   doxycycline (VIBRAMYCIN) 100 MG capsule, Take 1 capsule (100 mg total) by mouth 2 (two) times daily. One po bid x 7 days (Patient not taking: Reported on 11/28/2023), Disp: 14 capsule, Rfl: 0  No Known Allergies  Social History   Tobacco Use   Smoking status: Never    Passive exposure: Never   Smokeless tobacco: Never  Vaping Use   Vaping status: Never Used  Substance Use Topics   Alcohol use: Not Currently    Alcohol/week: 1.0 standard drink of alcohol    Types: 1 Cans of beer per week    Comment: occasionally   Drug use: Never     Objective:   Vitals:   11/28/23 1352  BP: (!) 147/83  Pulse: 62  Temp: 98.1 F (36.7 C)  Resp: 16  Height: 5' 11 (1.803 m)  Weight: (!) 306 lb (138.8 kg)  SpO2: 96%  BMI (Calculated): 42.7     Appears well, INAD Sclera anicteric TM retracted. Nares mild mucosal edema. No sinus tenderness. Oral mucosa moist Neck supple no LAD Heart RRR Normal resp effort and excursion. Lungs CTAB There is unilateral edema about the RLE, from the knee distally, approx twice the size of contralateral lower leg. There is erythema with induration noted to the distal third of the outer lower leg, with pitting edema noted anteriorly. Strong DP pulse. Sensation intact to light touch. Pain on compression of the lower leg, but negative Thompson test.

## 2023-11-28 NOTE — Telephone Encounter (Signed)
 Spoke with patient re: negative ultrasound We will treat for cellulitis failed initial outpatient management with doxy, will step up to clindamycin.    He also asked about his chronic congestion, lasting months at a time recurring multiple times a year. Likely allergies, doubtful bacterial resp infection. Recommended daily antihistamine. He would like allergy testing- advised I will forward concern to PCP to see if this can be ordered outright vs referral to allergy.

## 2023-11-29 NOTE — Telephone Encounter (Unsigned)
 Copied from CRM #[id number]. Topic: General - Other >> Nov 28, 2023  4:02 PM Roselie BROCKS wrote: Reason for CRM: Patient returning call to stephanie, Angela at clinic states to send message and she will return a call

## 2023-12-21 ENCOUNTER — Encounter: Payer: Self-pay | Admitting: Internal Medicine

## 2023-12-21 ENCOUNTER — Other Ambulatory Visit

## 2023-12-21 ENCOUNTER — Ambulatory Visit: Admitting: Internal Medicine

## 2023-12-21 VITALS — BP 142/90 | Ht 71.0 in | Wt 300.0 lb

## 2023-12-21 DIAGNOSIS — E042 Nontoxic multinodular goiter: Secondary | ICD-10-CM

## 2023-12-21 DIAGNOSIS — E063 Autoimmune thyroiditis: Secondary | ICD-10-CM | POA: Diagnosis not present

## 2023-12-21 MED ORDER — ZEPBOUND 7.5 MG/0.5ML ~~LOC~~ SOAJ: 7.5000 mg | SUBCUTANEOUS | 3 refills | Status: DC

## 2023-12-21 NOTE — Progress Notes (Signed)
 Name: Anthony Potts  MRN/ DOB: [id number], 11/05/1970    Age/ Sex: 53 y.o., male     PCP: Sebastian Beverley NOVAK, MD   Reason for Endocrinology Evaluation: Hypothyroidism     Initial Endocrinology Clinic Visit: 02/08/2023    PATIENT IDENTIFIER: Anthony Potts is a 53 y.o., male with a past medical history of OSA on CPAP (started 03/2023), multinodular goiter and Hashimoto's thyroiditis. He has followed with [facility] since 02/08/2023 for consultative assistance with management of his hypothyriodism.   HISTORICAL SUMMARY: The patient was first diagnosed with thyroid  disease in 2001.  A few years later he was diagnosed with hyperthyroidism, requiring oral medication (does not recall the name ), he was eventually diagnosed with multinodular goiter in 2005 .  At that time he did have thyroid  uptake and scan with a low-normal 24-hour radioactive iodine uptake at 12.3% .  Thyroid  ultrasound in 2004 confirmed the presence of multinodular goiter.  noted with elevated TSH at 11.17 u IU/mL in December, 2024  Transferred care from Dr. Dartha by  08/2023   Sister with thyrdoid disease  Anti-TPO antibodies elevated 25 international unit /mL   He is s/p benign FNA of the left thyroid  nodule 08/2023   OBESITY : Patient started on Zepbound  in August, 2025 with a weight of 332 LB's, BMI 46.3kg/m2   SUBJECTIVE:    Today (12/21/2023):  Anthony Potts is here for a follow up on Hypothyroidism.   The patient did present to urgent care for lower extremity cellulitis, was treated by doxycycline  in November, 2025  The patient has been noted with weight loss since his last visit here > 30 lbs  Continues to use CPAP for OSA  No nausea  Has had diarrhea while on Abx, stools have been  mushy  He is not consistent with probiotic use.  No local neck swelling  No palpitations  No tremors   He is not on MVI  He does brisk walking    Levothyroxine  100 mcg daily Zepbount 5 mg  weekly   HISTORY:  Past Medical History:  Past Medical History:  Diagnosis Date   Chronic intermittent hypoxia with obstructive sleep apnea 02/08/2023   Primary hypertension 12/14/2022   Past Surgical History: No past surgical history on file. Social History:  reports that he has never smoked. He has never been exposed to tobacco smoke. He has never used smokeless tobacco. He reports that he does not currently use alcohol after a past usage of about 1.0 standard drink of alcohol per week. He reports that he does not use drugs. Family History:  Family History  Problem Relation Age of Onset   Diabetes Mother    Cancer Sister      HOME MEDICATIONS: Allergies as of 12/21/2023   No Known Allergies      Medication List        Accurate as of December 21, 2023 11:06 AM. If you have any questions, ask your nurse or doctor.          STOP taking these medications    doxycycline  100 MG capsule Commonly known as: VIBRAMYCIN  Stopped by: Veida Spira J Tehani Mersman       TAKE these medications    levothyroxine  100 MCG tablet Commonly known as: SYNTHROID  Take 1 tablet (100 mcg total) by mouth daily.   Zepbound  5 MG/0.5ML Pen Generic drug: tirzepatide  Inject 5 mg into the skin once a week.          OBJECTIVE:  PHYSICAL EXAM: VS: BP (!) 142/90   Ht 5' 11 (1.803 m)   Wt 300 lb (136.1 kg)   BMI 41.84 kg/m      Filed Weights   12/21/23 1058  Weight: 300 lb (136.1 kg)      EXAM: General: Pt appears well and is in NAD  Neck: General: Supple without adenopathy. Thyroid : Right thyroid  asymmetry noted on exam today.   Lungs: Clear with good BS bilat   Heart: Auscultation: RRR.  Abdomen: soft, nontender  Extremities:  BL LE: No pretibial edema  Mental Status: Judgment, insight: Intact Orientation: Oriented to time, place, and person Mood and affect: No depression, anxiety, or agitation     DATA REVIEWED:  Latest Reference Range & Units 12/21/23 11:31  TSH  0.40 - 4.50 mIU/L 2.47  T4,Free(Direct) 0.8 - 1.8 ng/dL 1.1    Thyroid  uptake and scan 1//2005  Following oral ingestion of 13.6 uCi of iodine-131, a 24-hour radioactive iodine uptake of 12.3% was obtained (normal 10-35%). Following the IV injection of 10.0 mCi of pertechnetate, images were obtained which showed an inhomogeneous gland with an appearance most consistent with a multinodular gland. When correlating the appearance with the report from [facility] on 08/28/02, and from the patient's thyroid  ultrasound, the findings would correspond to the appearance on the ultrasound study.  IMPRESSION:   Low-normal 24-hour radioactive iodine uptake at 12.3%. The findings are most consistent with a multinodular goiter, as discussed above.      Thyroid  Ultrasound 08/18/2023  FINDINGS: Parenchymal Echotexture: Moderately heterogenous   Isthmus: 0.4 cm   Right lobe: 6.1 x 2.4 x 2.8 cm   Left lobe: 5.3 x 2.4 x 2.1 cm   _________________________________________________________   Estimated total number of nodules >/= 1 cm: 4   Number of spongiform nodules >/=  2 cm not described below (TR1): 0   Number of mixed cystic and solid nodules >/= 1.5 cm not described below (TR2): 0   _________________________________________________________   Nodule # 2: Solid isoechoic nodule in the right mid gland measures 2.1 x 1.8 x 1.6 cm. This is minimally changed compared to 1.7 x 1.5 x 1.4 cm reported on the prior study. Greater than 10 years of stability is consistent with benignity.   Nodule # 3: Ill-defined isoechoic solid nodule versus pseudo nodule in the right lower gland. Margins are difficult to distinguish from the background thyroid  parenchyma. A pseudo nodule is favored.   Nodule # 4: Hypoechoic solid nodule with peripheral calcifications in the left lower gland measures 1.7 x 1.4 x 1.3 cm. TI-RADS category 4. **Given size (>/= 1.5 cm) and appearance, fine  needle aspiration of this moderately suspicious nodule should be considered based on TI-RADS criteria.   Nodule # 5: Isoechoic solid nodule in the left lower gland measures approximately 1.8 x 1.8 x 1.7 cm compared to 1.7 x 1.2 x 1.1 cm previously. TI-RADS category 3. *Given size (>/= 1.5 - 2.4 cm) and appearance, a follow-up ultrasound in 1 year should be considered based on TI-RADS criteria.   IMPRESSION: 1. Comparison with the remote prior thyroid  ultrasound from 2004 is difficult as only measurements on the worksheet are available without any images for direct comparison. 2. Nodule # 4 is a 1.7 cm TI-RADS category 4 nodule in the left lower gland meets criteria to consider fine-needle aspiration biopsy. Biopsy is recommended. 3. Nodule # 5 is a 1.8 cm TI-RADS category 3 nodule in the left lower gland which may have  enlarged slightly over the past 10 years. Consider follow-up ultrasound in 1 year. 4. Right-sided thyroid  nodules demonstrate similar measurements and are likely stable and benign.    FNA Left Thyroid  Nodule 1.7 cm 09/05/2023 Clinical History: Nodule #4: Hypoechoic solid nodule with peripheral  calcifications in the left lower gland measures 1.7 x 1.4 x 1.3 cm.  TI-RADS category 4.  Specimen Submitted:  A. THYROID , LT INFERIOR, FINE NEEDLE ASPIRATION    FINAL MICROSCOPIC DIAGNOSIS:  - Benign follicular nodule ([place]hesda category II)    ASSESSMENT / PLAN / RECOMMENDATIONS:   1.Hashimoto's thyroiditis  - TPO antibodies elevated - Patient is clinically euthyroid - TFTs are within normal range, no change  Medications   Continue levothyroxine  100 mcg daily    2. Multinodular goiter:   - No local neck symptoms - Patient was noted with multinodular goiter in 2005, no follow-up has been done - Patient is s/p benign FNA of the left thyroid  nodule in August, 2025   3. Obesity :  - Patient has lost> 30 LB since his last visit here - Encouraged multivitamin  intake and strength training to prevent muscle wasting - Tolerating Zepbound , will increase the dose  Medication  Increase Zepbound7.5 mg weekly    Follow-up in 6 months    Signed electronically by: Stefano Redgie Butts, MD  [facility]  [facility] [address] [place] [place], [place] [address] Phone: [phone] FAX: [phone]      CC: Sebastian Beverley NOVAK, MD [address] [place] [address] Phone: [phone]  Fax: [phone]   Return to Endocrinology clinic as below: Future Appointments  Date Time Provider Department Center  12/21/2023 11:10 AM Ermal Haberer, Donell Redgie, MD [facility] None  01/18/2024 10:20 AM Sebastian Beverley NOVAK, MD [facility] [place] [place]  06/06/2024 11:15 AM Whitfield Raisin, NP [facility] None

## 2023-12-21 NOTE — Patient Instructions (Signed)

## 2023-12-22 ENCOUNTER — Ambulatory Visit: Payer: Self-pay | Admitting: Internal Medicine

## 2023-12-22 LAB — TSH: TSH: 2.47 m[IU]/L (ref 0.40–4.50)

## 2023-12-22 LAB — T4, FREE: Free T4: 1.1 ng/dL (ref 0.8–1.8)

## 2023-12-22 MED ORDER — ZEPBOUND 7.5 MG/0.5ML ~~LOC~~ SOAJ: 7.5000 mg | SUBCUTANEOUS | 3 refills | Status: AC

## 2024-01-18 ENCOUNTER — Ambulatory Visit: Admitting: Family Medicine

## 2024-01-18 VITALS — BP 136/83 | HR 66 | Temp 98.1°F | Ht 71.0 in | Wt 294.6 lb

## 2024-01-18 DIAGNOSIS — Z23 Encounter for immunization: Secondary | ICD-10-CM

## 2024-01-18 DIAGNOSIS — I872 Venous insufficiency (chronic) (peripheral): Secondary | ICD-10-CM

## 2024-01-18 DIAGNOSIS — Z125 Encounter for screening for malignant neoplasm of prostate: Secondary | ICD-10-CM

## 2024-01-18 DIAGNOSIS — E063 Autoimmune thyroiditis: Secondary | ICD-10-CM | POA: Diagnosis not present

## 2024-01-18 DIAGNOSIS — Z113 Encounter for screening for infections with a predominantly sexual mode of transmission: Secondary | ICD-10-CM

## 2024-01-18 DIAGNOSIS — G4733 Obstructive sleep apnea (adult) (pediatric): Secondary | ICD-10-CM | POA: Diagnosis not present

## 2024-01-18 DIAGNOSIS — I10 Essential (primary) hypertension: Secondary | ICD-10-CM

## 2024-01-18 DIAGNOSIS — E782 Mixed hyperlipidemia: Secondary | ICD-10-CM

## 2024-01-18 DIAGNOSIS — E66813 Obesity, class 3: Secondary | ICD-10-CM | POA: Diagnosis not present

## 2024-01-18 DIAGNOSIS — Z6841 Body Mass Index (BMI) 40.0 and over, adult: Secondary | ICD-10-CM | POA: Diagnosis not present

## 2024-01-18 DIAGNOSIS — K760 Fatty (change of) liver, not elsewhere classified: Secondary | ICD-10-CM

## 2024-01-18 DIAGNOSIS — Z0000 Encounter for general adult medical examination without abnormal findings: Secondary | ICD-10-CM

## 2024-01-18 LAB — CBC WITH DIFFERENTIAL/PLATELET
Basophils Absolute: 0.1 K/uL (ref 0.0–0.1)
Basophils Relative: 0.9 % (ref 0.0–3.0)
Eosinophils Absolute: 0.3 K/uL (ref 0.0–0.7)
Eosinophils Relative: 5.6 % — ABNORMAL HIGH (ref 0.0–5.0)
HCT: 47.5 % (ref 39.0–52.0)
Hemoglobin: 16.1 g/dL (ref 13.0–17.0)
Lymphocytes Relative: 35.5 % (ref 12.0–46.0)
Lymphs Abs: 2.1 K/uL (ref 0.7–4.0)
MCHC: 33.9 g/dL (ref 30.0–36.0)
MCV: 91.5 fl (ref 78.0–100.0)
Monocytes Absolute: 0.5 K/uL (ref 0.1–1.0)
Monocytes Relative: 9.2 % (ref 3.0–12.0)
Neutro Abs: 2.9 K/uL (ref 1.4–7.7)
Neutrophils Relative %: 48.8 % (ref 43.0–77.0)
Platelets: 171 K/uL (ref 150.0–400.0)
RBC: 5.19 Mil/uL (ref 4.22–5.81)
RDW: 14.3 % (ref 11.5–15.5)
WBC: 5.9 K/uL (ref 4.0–10.5)

## 2024-01-18 LAB — COMPREHENSIVE METABOLIC PANEL WITH GFR
ALT: 53 U/L (ref 3–53)
AST: 43 U/L — ABNORMAL HIGH (ref 5–37)
Albumin: 4.3 g/dL (ref 3.5–5.2)
Alkaline Phosphatase: 61 U/L (ref 39–117)
BUN: 16 mg/dL (ref 6–23)
CO2: 27 meq/L (ref 19–32)
Calcium: 9 mg/dL (ref 8.4–10.5)
Chloride: 105 meq/L (ref 96–112)
Creatinine, Ser: 0.85 mg/dL (ref 0.40–1.50)
GFR: 100.75 mL/min
Glucose, Bld: 84 mg/dL (ref 70–99)
Potassium: 3.9 meq/L (ref 3.5–5.1)
Sodium: 138 meq/L (ref 135–145)
Total Bilirubin: 0.6 mg/dL (ref 0.2–1.2)
Total Protein: 7.3 g/dL (ref 6.0–8.3)

## 2024-01-18 LAB — HEMOGLOBIN A1C: Hgb A1c MFr Bld: 5.4 % (ref 4.6–6.5)

## 2024-01-18 LAB — LIPID PANEL
Cholesterol: 171 mg/dL (ref 28–200)
HDL: 36.1 mg/dL — ABNORMAL LOW
LDL Cholesterol: 110 mg/dL — ABNORMAL HIGH (ref 10–99)
NonHDL: 134.42
Total CHOL/HDL Ratio: 5
Triglycerides: 124 mg/dL (ref 10.0–149.0)
VLDL: 24.8 mg/dL (ref 0.0–40.0)

## 2024-01-18 LAB — MICROALBUMIN / CREATININE URINE RATIO
Creatinine,U: 203.8 mg/dL
Microalb Creat Ratio: 5 mg/g (ref 0.0–30.0)
Microalb, Ur: 1 mg/dL (ref 0.7–1.9)

## 2024-01-18 LAB — HIGH SENSITIVITY CRP: CRP, High Sensitivity: 3.66 mg/L (ref 0.200–5.000)

## 2024-01-18 NOTE — Progress Notes (Signed)
 "  Assessment  Assessment/Plan:  Assessment and Plan Assessment & Plan Chronic venous insufficiency with stasis dermatitis Chronic venous insufficiency with stasis dermatitis in the right lower extremity, presenting with discoloration, swelling, and dermatitis. Likely due to venous reflux or venous stasis disease. No evidence of blood clot on ultrasound. Skin changes consistent with hemosiderin deposition and lipid thermosclerosis. Dermatitis likely due to skin stretching and moisture loss. - Recommended over-the-counter compression stockings to improve venous return and reduce swelling. - Advised use of lotion and topical hydrocortisone for dermatitis if needed. - Discussed potential use of diuretics if symptoms are unbearable, noting potential side effects.  Class 3 severe obesity with comorbid sleep apnea Class 3 severe obesity with a BMI of 41, contributing to comorbid sleep apnea. Currently managed with Zepbound  for weight loss and sleep apnea. Reports improvement in sleep quality with CPAP use, reducing nocturia. - Continue Zepbound  7.5 mg weekly for weight management and sleep apnea. - Continue CPAP therapy for sleep apnea. - Encouraged healthy lifestyle modifications to support weight loss.  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 Previous liver function tests showed mild elevation, possibly due to stress or infection. - Ordered liver function tests (LFTs) to assess current liver status. - Calculated Fib-4 score to determine need for further assessment. - Continue Zepbound  for weight management.  Mixed hyperlipidemia Previous elevated LDL levels. Cardiovascular risk stratification planned with additional lipid markers. - Ordered fasting lipid panel, lipoprotein A1 and B, and high sensitivity CRP for cardiovascular risk assessment. - Encouraged healthy lifestyle  modifications to manage hyperlipidemia.  Hashimoto's thyroiditis Managed by endocrinology. Recent TSH and T4 levels were within normal range. - Continue levothyroxine  100 mcg daily. - Follow up with endocrinology in three months for thyroid  management.  Diastasis recti Mid-abdomen, easily reducible and non-tender. No surgical intervention indicated. - Continue weight management to potentially reduce diastasis recti.  General Health Maintenance Discussion of vaccinations including DTAP, shingles, and pneumococcal vaccines. Shingles vaccine recommended due to age and potential severity of shingles in older adults. Pneumococcal vaccine recommended to prevent bacterial pneumonia. Discussed potential side effects of shingles vaccine, including feeling unwell, and its 95% effectiveness in preventing shingles. - Administered shingles vaccine today, with a second dose in one month. - Discussed benefits and side effects of vaccines, including potential for feeling unwell post-vaccination.     Chronic venous insufficiency with stasis dermatitis Chronic venous insufficiency with stasis dermatitis in the right lower extremity, presenting with discoloration, swelling, and dermatitis. Likely due to venous reflux or venous stasis disease. No evidence of blood clot on ultrasound. Skin changes consistent with hemosiderin deposition and lipid thermosclerosis. Dermatitis likely due to skin stretching and moisture loss. - Recommended over-the-counter compression stockings to improve venous return and reduce swelling. - Advised use of lotion and topical hydrocortisone for dermatitis if needed. - Discussed potential use of diuretics if symptoms are unbearable, noting potential side effects.  Class 3 severe obesity with comorbid sleep apnea Class 3 severe obesity with a BMI of 41, contributing to comorbid sleep apnea. Currently managed with Zepbound  for weight loss and sleep apnea. Reports improvement in sleep  quality with CPAP use, reducing nocturia. - Continue Zepbound  7.5 mg weekly for weight management and sleep apnea. - Continue CPAP therapy for sleep apnea. - Encouraged healthy lifestyle modifications to support weight loss.  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 Previous liver function tests showed mild  elevation, possibly due to stress or infection. - Ordered liver function tests (LFTs) to assess current liver status. - Calculated Fib-4 score to determine need for further assessment. - Continue Zepbound  for weight management.  Mixed hyperlipidemia Previous elevated LDL levels. Cardiovascular risk stratification planned with additional lipid markers. - Ordered fasting lipid panel, lipoprotein A1 and B, and high sensitivity CRP for cardiovascular risk assessment. - Encouraged healthy lifestyle modifications to manage hyperlipidemia.  Hashimoto's thyroiditis Managed by endocrinology. Recent TSH and T4 levels were within normal range. - Continue levothyroxine  100 mcg daily. - Follow up with endocrinology in three months for thyroid  management.  Diastasis recti Mid-abdomen, easily reducible and non-tender. No surgical intervention indicated. - Continue weight management to potentially reduce diastasis recti.  General Health Maintenance Discussion of vaccinations including DTAP, shingles, and pneumococcal vaccines. Shingles vaccine recommended due to age and potential severity of shingles in older adults. Pneumococcal vaccine recommended to prevent bacterial pneumonia. Discussed potential side effects of shingles vaccine, including feeling unwell, and its 95% effectiveness in preventing shingles. - Administered shingles vaccine today, with a second dose in one month. - Discussed benefits and side effects of vaccines, including potential for feeling unwell  post-vaccination.  Recording duration: 35 minutes     There are no discontinued medications.  Patient Counseling(The following topics were reviewed and/or handout was given):  -Nutrition: Stressed importance of moderation in sodium/caffeine intake, saturated fat and cholesterol, caloric balance, sufficient intake of fresh fruits, vegetables, and fiber.  -Stressed the importance of regular exercise.   -Substance Abuse: Discussed cessation/primary prevention of tobacco, alcohol, or other drug use; driving or other dangerous activities under the influence; availability of treatment for abuse.   -Injury prevention: Discussed safety belts, safety helmets, smoke detector, smoking near bedding or upholstery.   -Sexuality: Discussed sexually transmitted diseases, partner selection, use of condoms, avoidance of unintended pregnancy and contraceptive alternatives.   -Dental health: Discussed importance of regular tooth brushing, flossing, and dental visits.  -Health maintenance and immunizations reviewed. Please refer to Health maintenance section.  Follow up 1 year        Subjective:   Encounter date: 01/18/2024  Chief Complaint  Patient presents with   Follow-up    Physical: Patient wants some information shingles, Tdap, pneumoccocal vaccines. Also wants to discuss his antibiotics that he was given in November.    Discussed the use of AI scribe software for clinical note transcription with the patient, who gave verbal consent to proceed.  History of Present Illness Anthony Potts is a 54 year old male who presents for an annual physical exam and to discuss leg discoloration and vaccine inquiries.  Right lower extremity skin changes - Persistent discoloration and bruising around the right ankle for three months following cellulitis episode treated with doxycycline  in November - Skin described as 'like brand new' with peeling similar to a sunburn - No current pain in the  leg - Dry and itchy skin present on the edge of the right foot - Significant swelling occurred during cellulitis episode, with leg swelling to three times its normal size - Ultrasound ruled out blood clot; uncertain about venous reflux findings  Immunization status and vaccine inquiries - Inquiry regarding Tdap, pneumococcal, and shingles vaccines - Received tetanus booster in September 2021 after toe injury - Interested in shingles vaccine upon turning 52, with history of chickenpox  Allergy testing inquiry - Desires allergy testing despite no specific allergies identified - Understands allergy testing is typically performed by an allergist  Sleep apnea - History of sleep apnea - Uses CPAP machine, resulting in improved sleep and reduced nocturnal awakenings to urinate - Uses CPAP for a minimum of four hours per night, sometimes while watching TV before bed  Obesity and weight management - On Zepbound  for weight management related to sleep apnea and obesity  Fatty liver disease - History of fatty liver disease  Hashimoto's thyroiditis - Under treatment with levothyroxine   Sexually transmitted infection screening request - Requests screening for gonorrhea, chlamydia, trichomonas, HIV, hepatitis B, hepatitis C, and syphilis - No symptoms or concerns prompting the request - Request prompted by conversation with new partner   Anthony Potts is a 54 year old male who presents for an annual physical exam and to discuss leg discoloration and vaccine inquiries.  Right lower extremity skin changes - Persistent discoloration and bruising around the right ankle for three months following cellulitis episode treated with doxycycline  in November - Skin described as 'like brand new' with peeling similar to a sunburn - No current pain in the leg - Dry and itchy skin present on the edge of the right foot - Significant swelling occurred during cellulitis episode, with leg swelling to  three times its normal size - Ultrasound ruled out blood clot; uncertain about venous reflux findings  Immunization status and vaccine inquiries - Inquiry regarding Tdap, pneumococcal, and shingles vaccines - Received tetanus booster in September 2021 after toe injury - Interested in shingles vaccine upon turning 52, with history of chickenpox  Allergy testing inquiry - Desires allergy testing despite no specific allergies identified - Understands allergy testing is typically performed by an allergist  Sleep apnea - History of sleep apnea - Uses CPAP machine, resulting in improved sleep and reduced nocturnal awakenings to urinate - Uses CPAP for a minimum of four hours per night, sometimes while watching TV before bed  Obesity and weight management - On Zepbound  for weight management related to sleep apnea and obesity  Fatty liver disease - History of fatty liver disease  Hashimoto's thyroiditis - Under treatment with levothyroxine   Sexually transmitted infection screening request - Requests screening for gonorrhea, chlamydia, trichomonas, HIV, hepatitis B, hepatitis C, and syphilis - No symptoms or concerns prompting the request - Request prompted by conversation with new partner       01/18/2024   10:26 AM 12/03/2022    3:54 PM  Depression screen PHQ 2/9  Decreased Interest 0 0  Down, Depressed, Hopeless 0 0  PHQ - 2 Score 0 0  Altered sleeping  0  Tired, decreased energy  0  Change in appetite  0  Feeling bad or failure about yourself   0  Trouble concentrating  0  Moving slowly or fidgety/restless  0  Suicidal thoughts  0  PHQ-9 Score  0   Difficult doing work/chores  Not difficult at all     Data saved with a previous flowsheet row definition       01/18/2024   10:26 AM 12/03/2022    3:54 PM  GAD 7 : Generalized Anxiety Score  Nervous, Anxious, on Edge 0 0  Control/stop worrying 0 0  Worry too much - different things 0 0  Trouble relaxing 0 0  Restless  0 0  Easily annoyed or irritable 0 0  Afraid - awful might happen 0 0  Total GAD 7 Score 0 0  Anxiety Difficulty Not difficult at all Not difficult at all    Health Maintenance Due  Topic Date  Due   DTaP/Tdap/Td (1 - Tdap) Never done   COVID-19 Vaccine (1 - 2025-26 season) Never done    PMH:  The following were reviewed and entered/updated in epic: Past Medical History:  Diagnosis Date   Chronic intermittent hypoxia with obstructive sleep apnea 02/08/2023   Primary hypertension 12/14/2022    Patient Active Problem List   Diagnosis Date Noted   Hashimoto's thyroiditis 12/21/2023   Obesity, unspecified 08/18/2023   Multinodular goiter 08/18/2023   Severe obstructive sleep apnea-hypopnea syndrome 02/08/2023   Loud snoring 02/08/2023   Primary hypertension 12/14/2022   Nocturia 12/14/2022   Snoring 12/14/2022   Acquired hypothyroidism 12/03/2022   Hyperlipidemia 12/03/2022   Class 3 severe obesity due to excess calories with body mass index (BMI) of 40.0 to 44.9 in adult [facility]) 12/03/2022   Chronic pain of left knee 12/03/2022   Abdominal distention 12/03/2022   Metabolic dysfunction-associated steatotic liver disease (MASLD) 12/03/2022    No past surgical history on file.  Family History  Problem Relation Age of Onset   Diabetes Mother    Cancer Sister     Medications- reviewed and updated Outpatient Medications Prior to Visit  Medication Sig Dispense Refill   levothyroxine  (SYNTHROID ) 100 MCG tablet Take 1 tablet (100 mcg total) by mouth daily. 90 tablet 3   tirzepatide  (ZEPBOUND ) 7.5 MG/0.5ML Pen Inject 7.5 mg into the skin once a week. 6 mL 3   No facility-administered medications prior to visit.    Allergies[1]  Social History   Socioeconomic History   Marital status: Single    Spouse name: Not on file   Number of children: 2   Years of education: Not on file   Highest education level: Doctorate  Occupational History   Not on file  Tobacco Use    Smoking status: Never    Passive exposure: Never   Smokeless tobacco: Never  Vaping Use   Vaping status: Never Used  Substance and Sexual Activity   Alcohol use: Not Currently    Alcohol/week: 1.0 standard drink of alcohol    Types: 1 Cans of beer per week    Comment: occasionally   Drug use: Never   Sexual activity: Yes    Birth control/protection: Condom  Other Topics Concern   Not on file  Social History Narrative   Right handed    Wears reader glasses    Drinks fanta orange ( occ)   Social Drivers of Health   Tobacco Use: Low Risk (12/21/2023)   Patient History    Smoking Tobacco Use: Never    Smokeless Tobacco Use: Never    Passive Exposure: Never  Financial Resource Strain: High Risk (01/18/2024)   Overall Financial Resource Strain (CARDIA)    Difficulty of Paying Living Expenses: Very hard  Food Insecurity: Food Insecurity Present (01/18/2024)   Epic    Worried About Programme Researcher, Broadcasting/film/video in the Last Year: Often true    Ran Out of Food in the Last Year: Often true  Transportation Needs: Unmet Transportation Needs (01/18/2024)   Epic    Lack of Transportation (Medical): Yes    Lack of Transportation (Non-Medical): Yes  Physical Activity: Sufficiently Active (01/18/2024)   Exercise Vital Sign    Days of Exercise per Week: 7 days    Minutes of Exercise per Session: 150+ min  Stress: Stress Concern Present (01/18/2024)   [employer] of Occupational Health - Occupational Stress Questionnaire    Feeling of Stress: Very much  Social Connections: Moderately Integrated (  01/18/2024)   Social Connection and Isolation Panel    Frequency of Communication with Friends and Family: More than three times a week    Frequency of Social Gatherings with Friends and Family: More than three times a week    Attends Religious Services: More than 4 times per year    Active Member of Clubs or Organizations: Yes    Attends Banker Meetings: More than 4 times per year    Marital  Status: Never married  Depression (PHQ2-9): Low Risk (01/18/2024)   Depression (PHQ2-9)    PHQ-2 Score: 0  Alcohol Screen: High Risk (01/18/2024)   Alcohol Screen    Last Alcohol Screening Score (AUDIT): 40  Housing: High Risk (01/18/2024)   Epic    Unable to Pay for Housing in the Last Year: Yes    Number of Times Moved in the Last Year: 6    Homeless in the Last Year: Yes  Utilities: Not on file  Health Literacy: Not on file           Objective:  Physical Exam: BP 136/83 (BP Location: Left Arm, Patient Position: Sitting, Cuff Size: Large)   Pulse 66   Temp 98.1 F (36.7 C)   Ht 5' 11 (1.803 m)   Wt 294 lb 9.6 oz (133.6 kg)   SpO2 96%   BMI 41.09 kg/m   Body mass index is 41.09 kg/m. Wt Readings from Last 3 Encounters:  01/18/24 294 lb 9.6 oz (133.6 kg)  12/21/23 300 lb (136.1 kg)  11/28/23 (!) 306 lb (138.8 kg)    Physical Exam MEASUREMENTS: BMI- 41.0. GENERAL: Alert, cooperative, well developed, no acute distress HEENT: Normocephalic, normal oropharynx, moist mucous membranes CHEST: Clear to auscultation bilaterally, no wheezes, rhonchi, or crackles CARDIOVASCULAR: Normal heart rate and rhythm, S1 and S2 normal without murmurs ABDOMEN: Soft, non-tender, non-distended, without organomegaly, normal bowel sounds. Diastasis recti mid abdomen, easily reducible, non-tender. EXTREMITIES: Bilateral +1 pitting edema to mid shins, hemosiderin deposition, lipodermatosclerosis, scattered telangiectasias on bilateral lower extremities, dermatitis, scaling of right lateral foot. NEUROLOGICAL: Cranial nerves grossly intact, moves all extremities without gross motor or sensory deficit   MEASUREMENTS: BMI- 41.0. GENERAL: Alert, cooperative, well developed, no acute distress HEENT: Normocephalic, normal oropharynx, moist mucous membranes CHEST: Clear to auscultation bilaterally, no wheezes, rhonchi, or crackles CARDIOVASCULAR: Normal heart rate and rhythm, S1 and S2 normal without  murmurs ABDOMEN: Soft, non-tender, non-distended, without organomegaly, normal bowel sounds. Diastasis recti mid abdomen, easily reducible, non-tender. EXTREMITIES: Bilateral +1 pitting edema to mid shins, hemosiderin deposition, lipodermatosclerosis, scattered telangiectasias on bilateral lower extremities, dermatitis, scaling of right lateral foot. NEUROLOGICAL: Cranial nerves grossly intact, moves all extremities without gross motor or sensory deficit  Physical Exam      Prior labs:   Recent Results (from the past 2160 hours)  Comprehensive metabolic panel     Status: Abnormal   Collection Time: 11/21/23  8:34 PM  Result Value Ref Range   Sodium 140 135 - 145 mmol/L   Potassium 4.6 3.5 - 5.1 mmol/L   Chloride 106 98 - 111 mmol/L   CO2 23 22 - 32 mmol/L   Glucose, Bld 102 (H) 70 - 99 mg/dL    Comment: Glucose reference range applies only to samples taken after fasting for at least 8 hours.   BUN 15 6 - 20 mg/dL   Creatinine, Ser 8.84 0.61 - 1.24 mg/dL   Calcium 9.7 8.9 - 89.6 mg/dL   Total Protein 7.8 6.5 - 8.1  g/dL   Albumin 4.3 3.5 - 5.0 g/dL   AST 46 (H) 15 - 41 U/L   ALT 67 (H) 0 - 44 U/L   Alkaline Phosphatase 81 38 - 126 U/L   Total Bilirubin 1.5 (H) 0.0 - 1.2 mg/dL   GFR, Estimated >39 >39 mL/min    Comment: (NOTE) Calculated using the CKD-EPI Creatinine Equation (2021)    Anion gap 11 5 - 15    Comment: Performed at [facility], [address] [place], [place] [address]  CBC with Differential     Status: Abnormal   Collection Time: 11/21/23  8:34 PM  Result Value Ref Range   WBC 11.0 (H) 4.0 - 10.5 K/uL   RBC 5.19 4.22 - 5.81 MIL/uL   Hemoglobin 16.2 13.0 - 17.0 g/dL   HCT 51.8 60.9 - 47.9 %   MCV 92.7 80.0 - 100.0 fL   MCH 31.2 26.0 - 34.0 pg   MCHC 33.7 30.0 - 36.0 g/dL   RDW 86.2 88.4 - 84.4 %   Platelets 166 150 - 400 K/uL   nRBC 0.0 0.0 - 0.2 %   Neutrophils Relative % 69 %   Neutro Abs 7.6 1.7 - 7.7 K/uL   Lymphocytes Relative 17  %   Lymphs Abs 1.8 0.7 - 4.0 K/uL   Monocytes Relative 13 %   Monocytes Absolute 1.4 (H) 0.1 - 1.0 K/uL   Eosinophils Relative 0 %   Eosinophils Absolute 0.0 0.0 - 0.5 K/uL   Basophils Relative 1 %   Basophils Absolute 0.1 0.0 - 0.1 K/uL   Immature Granulocytes 0 %   Abs Immature Granulocytes 0.03 0.00 - 0.07 K/uL    Comment: Performed at [facility], [address], [place], [place] [address]  I-Stat Lactic Acid, ED     Status: None   Collection Time: 11/21/23  8:39 PM  Result Value Ref Range   Lactic Acid, Venous 0.7 0.5 - 1.9 mmol/L  TSH     Status: None   Collection Time: 12/21/23 11:31 AM  Result Value Ref Range   TSH 2.47 0.40 - 4.50 mIU/L  T4, free     Status: None   Collection Time: 12/21/23 11:31 AM  Result Value Ref Range   Free T4 1.1 0.8 - 1.8 ng/dL    Lab Results  Component Value Date   CHOL 195 12/14/2022   Lab Results  Component Value Date   HDL 36.90 (L) 12/14/2022   Lab Results  Component Value Date   LDLCALC 133 (H) 12/14/2022   Lab Results  Component Value Date   TRIG 125.0 12/14/2022   Lab Results  Component Value Date   CHOLHDL 5 12/14/2022   No results found for: LDLDIRECT  Last metabolic panel Lab Results  Component Value Date   GLUCOSE 102 (H) 11/21/2023   NA 140 11/21/2023   K 4.6 11/21/2023   CL 106 11/21/2023   CO2 23 11/21/2023   BUN 15 11/21/2023   CREATININE 1.15 11/21/2023   GFRNONAA >60 11/21/2023   CALCIUM 9.7 11/21/2023   PROT 7.8 11/21/2023   ALBUMIN 4.3 11/21/2023   BILITOT 1.5 (H) 11/21/2023   ALKPHOS 81 11/21/2023   AST 46 (H) 11/21/2023   ALT 67 (H) 11/21/2023   ANIONGAP 11 11/21/2023    Lab Results  Component Value Date   HGBA1C 5.9 12/14/2022    Last CBC Lab Results  Component Value Date   WBC 11.0 (H) 11/21/2023  HGB 16.2 11/21/2023   HCT 48.1 11/21/2023   MCV 92.7 11/21/2023   MCH 31.2 11/21/2023   RDW 13.7 11/21/2023   PLT 166 11/21/2023    Lab Results  Component  Value Date   TSH 2.47 12/21/2023    Lab Results  Component Value Date   PSA 1.39 12/14/2022    Last vitamin D No results found for: 25OHVITD2, 25OHVITD3, VD25OH  No results found for: COLORU, CLARITYU, GLUCOSEUR, BILIRUBINUR, KETONESU, SPECGRAV, RBCUR, PHUR, PROTEINUR, UROBILINOGEN, LEUKOCYTESUR  No results found for: LABMICR, MICROALBUR   At today's visit, we discussed treatment options, associated risk and benefits, and engage in counseling as needed.  Additionally the following were reviewed: Past medical records, past medical and surgical history, family and social background, as well as relevant laboratory results, imaging findings, and specialty notes, where applicable.  This message was generated using dictation software, and as a result, it may contain unintentional typos or errors.  Nevertheless, extensive effort was made to accurately convey at the pertinent aspects of the patient visit.    There may have been are other unrelated non-urgent complaints, but due to the busy schedule and the amount of time already spent with him, time does not permit to address these issues at today's visit. Another appointment may have or has been requested to review these additional issues.     Arvella Hummer, MD, MS       [1] No Known Allergies  "

## 2024-01-18 NOTE — Progress Notes (Deleted)
 "  Assessment  Assessment/Plan:  Assessment and Plan Assessment & Plan Chronic venous insufficiency with stasis dermatitis Chronic venous insufficiency with stasis dermatitis in the right lower extremity, presenting with discoloration, swelling, and dermatitis. Likely due to venous reflux or venous stasis disease. No evidence of blood clot on ultrasound. Skin changes consistent with hemosiderin deposition and lipid thermosclerosis. Dermatitis likely due to skin stretching and moisture loss. - Recommended over-the-counter compression stockings to improve venous return and reduce swelling. - Advised use of lotion and topical hydrocortisone for dermatitis if needed. - Discussed potential use of diuretics if symptoms are unbearable, noting potential side effects.  Class 3 severe obesity with comorbid sleep apnea Class 3 severe obesity with a BMI of 41, contributing to comorbid sleep apnea. Currently managed with Zepbound  for weight loss and sleep apnea. Reports improvement in sleep quality with CPAP use, reducing nocturia. - Continue Zepbound  7.5 mg weekly for weight management and sleep apnea. - Continue CPAP therapy for sleep apnea. - Encouraged healthy lifestyle modifications to support weight loss.  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 Previous liver function tests showed mild elevation, possibly due to stress or infection. - Ordered liver function tests (LFTs) to assess current liver status. - Calculated Fib-4 score to determine need for further assessment. - Continue Zepbound  for weight management.  Mixed hyperlipidemia Previous elevated LDL levels. Cardiovascular risk stratification planned with additional lipid markers. - Ordered fasting lipid panel, lipoprotein A1 and B, and high sensitivity CRP for cardiovascular risk assessment. - Encouraged healthy lifestyle  modifications to manage hyperlipidemia.  Hashimoto's thyroiditis Managed by endocrinology. Recent TSH and T4 levels were within normal range. - Continue levothyroxine  100 mcg daily. - Follow up with endocrinology in three months for thyroid  management.  Diastasis recti Mid-abdomen, easily reducible and non-tender. No surgical intervention indicated. - Continue weight management to potentially reduce diastasis recti.  General Health Maintenance Discussion of vaccinations including DTAP, shingles, and pneumococcal vaccines. Shingles vaccine recommended due to age and potential severity of shingles in older adults. Pneumococcal vaccine recommended to prevent bacterial pneumonia. Discussed potential side effects of shingles vaccine, including feeling unwell, and its 95% effectiveness in preventing shingles. - Administered shingles vaccine today, with a second dose in one month. - Discussed benefits and side effects of vaccines, including potential for feeling unwell post-vaccination.       There are no discontinued medications.  Patient Counseling(The following topics were reviewed and/or handout was given):  -Nutrition: Stressed importance of moderation in sodium/caffeine intake, saturated fat and cholesterol, caloric balance, sufficient intake of fresh fruits, vegetables, and fiber.  -Stressed the importance of regular exercise.   -Substance Abuse: Discussed cessation/primary prevention of tobacco, alcohol, or other drug use; driving or other dangerous activities under the influence; availability of treatment for abuse.   -Injury prevention: Discussed safety belts, safety helmets, smoke detector, smoking near bedding or upholstery.   -Sexuality: Discussed sexually transmitted diseases, partner selection, use of condoms, avoidance of unintended pregnancy and contraceptive alternatives.   -Dental health: Discussed importance of regular tooth brushing, flossing, and dental visits.  -Health  maintenance and immunizations reviewed. Please refer to Health maintenance section.  Follow up 1 year        Subjective:   Encounter date: 01/18/2024  Chief Complaint  Patient presents with   Follow-up    Physical: Patient wants some information shingles, Tdap, pneumoccocal vaccines. Also wants to discuss his antibiotics that he was given in November.  Discussed the use of AI scribe software for clinical note transcription with the patient, who gave verbal consent to proceed.  History of Present Illness Anthony Potts is a 54 year old male who presents for an annual physical exam and to discuss leg discoloration and vaccine inquiries.  Right lower extremity skin changes - Persistent discoloration and bruising around the right ankle for three months following cellulitis episode treated with doxycycline  in November - Skin described as 'like brand new' with peeling similar to a sunburn - No current pain in the leg - Dry and itchy skin present on the edge of the right foot - Significant swelling occurred during cellulitis episode, with leg swelling to three times its normal size - Ultrasound ruled out blood clot; uncertain about venous reflux findings  Immunization status and vaccine inquiries - Inquiry regarding Tdap, pneumococcal, and shingles vaccines - Received tetanus booster in September 2021 after toe injury - Interested in shingles vaccine upon turning 52, with history of chickenpox  Allergy testing inquiry - Desires allergy testing despite no specific allergies identified - Understands allergy testing is typically performed by an allergist  Sleep apnea - History of sleep apnea - Uses CPAP machine, resulting in improved sleep and reduced nocturnal awakenings to urinate - Uses CPAP for a minimum of four hours per night, sometimes while watching TV before bed  Obesity and weight management - On Zepbound  for weight management related to sleep apnea and  obesity  Fatty liver disease - History of fatty liver disease  Hashimoto's thyroiditis - Under treatment with levothyroxine   Sexually transmitted infection screening request - Requests screening for gonorrhea, chlamydia, trichomonas, HIV, hepatitis B, hepatitis C, and syphilis - No symptoms or concerns prompting the request - Request prompted by conversation with new partner       01/18/2024   10:26 AM 12/03/2022    3:54 PM  Depression screen PHQ 2/9  Decreased Interest 0 0  Down, Depressed, Hopeless 0 0  PHQ - 2 Score 0 0  Altered sleeping  0  Tired, decreased energy  0  Change in appetite  0  Feeling bad or failure about yourself   0  Trouble concentrating  0  Moving slowly or fidgety/restless  0  Suicidal thoughts  0  PHQ-9 Score  0   Difficult doing work/chores  Not difficult at all     Data saved with a previous flowsheet row definition       01/18/2024   10:26 AM 12/03/2022    3:54 PM  GAD 7 : Generalized Anxiety Score  Nervous, Anxious, on Edge 0 0  Control/stop worrying 0 0  Worry too much - different things 0 0  Trouble relaxing 0 0  Restless 0 0  Easily annoyed or irritable 0 0  Afraid - awful might happen 0 0  Total GAD 7 Score 0 0  Anxiety Difficulty Not difficult at all Not difficult at all    Health Maintenance Due  Topic Date Due   DTaP/Tdap/Td (1 - Tdap) Never done   COVID-19 Vaccine (1 - 2025-26 season) Never done    PMH:  The following were reviewed and entered/updated in epic: Past Medical History:  Diagnosis Date   Chronic intermittent hypoxia with obstructive sleep apnea 02/08/2023   Primary hypertension 12/14/2022    Patient Active Problem List   Diagnosis Date Noted   Hashimoto's thyroiditis 12/21/2023   Obesity, unspecified 08/18/2023   Multinodular goiter 08/18/2023   Severe obstructive sleep apnea-hypopnea syndrome  02/08/2023   Loud snoring 02/08/2023   Primary hypertension 12/14/2022   Nocturia 12/14/2022   Snoring  12/14/2022   Acquired hypothyroidism 12/03/2022   Hyperlipidemia 12/03/2022   Class 3 severe obesity due to excess calories with body mass index (BMI) of 40.0 to 44.9 in adult [facility]) 12/03/2022   Chronic pain of left knee 12/03/2022   Abdominal distention 12/03/2022   Metabolic dysfunction-associated steatotic liver disease (MASLD) 12/03/2022    No past surgical history on file.  Family History  Problem Relation Age of Onset   Diabetes Mother    Cancer Sister     Medications- reviewed and updated Outpatient Medications Prior to Visit  Medication Sig Dispense Refill   levothyroxine  (SYNTHROID ) 100 MCG tablet Take 1 tablet (100 mcg total) by mouth daily. 90 tablet 3   tirzepatide  (ZEPBOUND ) 7.5 MG/0.5ML Pen Inject 7.5 mg into the skin once a week. 6 mL 3   No facility-administered medications prior to visit.    Allergies[1]  Social History   Socioeconomic History   Marital status: Single    Spouse name: Not on file   Number of children: 2   Years of education: Not on file   Highest education level: Doctorate  Occupational History   Not on file  Tobacco Use   Smoking status: Never    Passive exposure: Never   Smokeless tobacco: Never  Vaping Use   Vaping status: Never Used  Substance and Sexual Activity   Alcohol use: Not Currently    Alcohol/week: 1.0 standard drink of alcohol    Types: 1 Cans of beer per week    Comment: occasionally   Drug use: Never   Sexual activity: Yes    Birth control/protection: Condom  Other Topics Concern   Not on file  Social History Narrative   Right handed    Wears reader glasses    Drinks fanta orange ( occ)   Social Drivers of Health   Tobacco Use: Low Risk (12/21/2023)   Patient History    Smoking Tobacco Use: Never    Smokeless Tobacco Use: Never    Passive Exposure: Never  Financial Resource Strain: High Risk (01/18/2024)   Overall Financial Resource Strain (CARDIA)    Difficulty of Paying Living Expenses: Very hard   Food Insecurity: Food Insecurity Present (01/18/2024)   Epic    Worried About Programme Researcher, Broadcasting/film/video in the Last Year: Often true    Ran Out of Food in the Last Year: Often true  Transportation Needs: Unmet Transportation Needs (01/18/2024)   Epic    Lack of Transportation (Medical): Yes    Lack of Transportation (Non-Medical): Yes  Physical Activity: Sufficiently Active (01/18/2024)   Exercise Vital Sign    Days of Exercise per Week: 7 days    Minutes of Exercise per Session: 150+ min  Stress: Stress Concern Present (01/18/2024)   [employer] of Occupational Health - Occupational Stress Questionnaire    Feeling of Stress: Very much  Social Connections: Moderately Integrated (01/18/2024)   Social Connection and Isolation Panel    Frequency of Communication with Friends and Family: More than three times a week    Frequency of Social Gatherings with Friends and Family: More than three times a week    Attends Religious Services: More than 4 times per year    Active Member of [employer] or Organizations: Yes    Attends Banker Meetings: More than 4 times per year    Marital Status: Never married  Depression (  PHQ2-9): Low Risk (01/18/2024)   Depression (PHQ2-9)    PHQ-2 Score: 0  Alcohol Screen: High Risk (01/18/2024)   Alcohol Screen    Last Alcohol Screening Score (AUDIT): 40  Housing: High Risk (01/18/2024)   Epic    Unable to Pay for Housing in the Last Year: Yes    Number of Times Moved in the Last Year: 6    Homeless in the Last Year: Yes  Utilities: Not on file  Health Literacy: Not on file           Objective:  Physical Exam: BP 136/83 (BP Location: Left Arm, Patient Position: Sitting, Cuff Size: Large)   Pulse 66   Temp 98.1 F (36.7 C)   Ht 5' 11 (1.803 m)   Wt 294 lb 9.6 oz (133.6 kg)   SpO2 96%   BMI 41.09 kg/m   Body mass index is 41.09 kg/m. Wt Readings from Last 3 Encounters:  01/18/24 294 lb 9.6 oz (133.6 kg)  12/21/23 300 lb (136.1 kg)  11/28/23 (!)  306 lb (138.8 kg)    Physical Exam MEASUREMENTS: BMI- 41.0. GENERAL: Alert, cooperative, well developed, no acute distress HEENT: Normocephalic, normal oropharynx, moist mucous membranes CHEST: Clear to auscultation bilaterally, no wheezes, rhonchi, or crackles CARDIOVASCULAR: Normal heart rate and rhythm, S1 and S2 normal without murmurs ABDOMEN: Soft, non-tender, non-distended, without organomegaly, normal bowel sounds. Diastasis recti mid abdomen, easily reducible, non-tender. EXTREMITIES: Bilateral +1 pitting edema to mid shins, hemosiderin deposition, lipodermatosclerosis, scattered telangiectasias on bilateral lower extremities, dermatitis, scaling of right lateral foot. NEUROLOGICAL: Cranial nerves grossly intact, moves all extremities without gross motor or sensory deficit  Physical Exam      Prior labs:   Recent Results (from the past 2160 hours)  Comprehensive metabolic panel     Status: Abnormal   Collection Time: 11/21/23  8:34 PM  Result Value Ref Range   Sodium 140 135 - 145 mmol/L   Potassium 4.6 3.5 - 5.1 mmol/L   Chloride 106 98 - 111 mmol/L   CO2 23 22 - 32 mmol/L   Glucose, Bld 102 (H) 70 - 99 mg/dL    Comment: Glucose reference range applies only to samples taken after fasting for at least 8 hours.   BUN 15 6 - 20 mg/dL   Creatinine, Ser 8.84 0.61 - 1.24 mg/dL   Calcium 9.7 8.9 - 89.6 mg/dL   Total Protein 7.8 6.5 - 8.1 g/dL   Albumin 4.3 3.5 - 5.0 g/dL   AST 46 (H) 15 - 41 U/L   ALT 67 (H) 0 - 44 U/L   Alkaline Phosphatase 81 38 - 126 U/L   Total Bilirubin 1.5 (H) 0.0 - 1.2 mg/dL   GFR, Estimated >39 >39 mL/min    Comment: (NOTE) Calculated using the CKD-EPI Creatinine Equation (2021)    Anion gap 11 5 - 15    Comment: Performed at [facility], [address], [place], [place] [address]  CBC with Differential     Status: Abnormal   Collection Time: 11/21/23  8:34 PM  Result Value Ref Range   WBC 11.0 (H) 4.0 - 10.5 K/uL   RBC  5.19 4.22 - 5.81 MIL/uL   Hemoglobin 16.2 13.0 - 17.0 g/dL   HCT 51.8 60.9 - 47.9 %   MCV 92.7 80.0 - 100.0 fL   MCH 31.2 26.0 - 34.0 pg   MCHC 33.7 30.0 - 36.0 g/dL   RDW 86.2 88.4 - 84.4 %  Platelets 166 150 - 400 K/uL   nRBC 0.0 0.0 - 0.2 %   Neutrophils Relative % 69 %   Neutro Abs 7.6 1.7 - 7.7 K/uL   Lymphocytes Relative 17 %   Lymphs Abs 1.8 0.7 - 4.0 K/uL   Monocytes Relative 13 %   Monocytes Absolute 1.4 (H) 0.1 - 1.0 K/uL   Eosinophils Relative 0 %   Eosinophils Absolute 0.0 0.0 - 0.5 K/uL   Basophils Relative 1 %   Basophils Absolute 0.1 0.0 - 0.1 K/uL   Immature Granulocytes 0 %   Abs Immature Granulocytes 0.03 0.00 - 0.07 K/uL    Comment: Performed at [facility], [address] [place], [place] [address]  I-Stat Lactic Acid, ED     Status: None   Collection Time: 11/21/23  8:39 PM  Result Value Ref Range   Lactic Acid, Venous 0.7 0.5 - 1.9 mmol/L  TSH     Status: None   Collection Time: 12/21/23 11:31 AM  Result Value Ref Range   TSH 2.47 0.40 - 4.50 mIU/L  T4, free     Status: None   Collection Time: 12/21/23 11:31 AM  Result Value Ref Range   Free T4 1.1 0.8 - 1.8 ng/dL    Lab Results  Component Value Date   CHOL 195 12/14/2022   Lab Results  Component Value Date   HDL 36.90 (L) 12/14/2022   Lab Results  Component Value Date   LDLCALC 133 (H) 12/14/2022   Lab Results  Component Value Date   TRIG 125.0 12/14/2022   Lab Results  Component Value Date   CHOLHDL 5 12/14/2022   No results found for: LDLDIRECT  Last metabolic panel Lab Results  Component Value Date   GLUCOSE 102 (H) 11/21/2023   NA 140 11/21/2023   K 4.6 11/21/2023   CL 106 11/21/2023   CO2 23 11/21/2023   BUN 15 11/21/2023   CREATININE 1.15 11/21/2023   GFRNONAA >60 11/21/2023   CALCIUM 9.7 11/21/2023   PROT 7.8 11/21/2023   ALBUMIN 4.3 11/21/2023   BILITOT 1.5 (H) 11/21/2023   ALKPHOS 81 11/21/2023   AST 46 (H) 11/21/2023   ALT 67 (H)  11/21/2023   ANIONGAP 11 11/21/2023    Lab Results  Component Value Date   HGBA1C 5.9 12/14/2022    Last CBC Lab Results  Component Value Date   WBC 11.0 (H) 11/21/2023   HGB 16.2 11/21/2023   HCT 48.1 11/21/2023   MCV 92.7 11/21/2023   MCH 31.2 11/21/2023   RDW 13.7 11/21/2023   PLT 166 11/21/2023    Lab Results  Component Value Date   TSH 2.47 12/21/2023    Lab Results  Component Value Date   PSA 1.39 12/14/2022    Last vitamin D No results found for: 25OHVITD2, 25OHVITD3, VD25OH  No results found for: COLORU, CLARITYU, GLUCOSEUR, BILIRUBINUR, KETONESU, SPECGRAV, RBCUR, PHUR, PROTEINUR, UROBILINOGEN, LEUKOCYTESUR  No results found for: LABMICR, MICROALBUR   At today's visit, we discussed treatment options, associated risk and benefits, and engage in counseling as needed.  Additionally the following were reviewed: Past medical records, past medical and surgical history, family and social background, as well as relevant laboratory results, imaging findings, and specialty notes, where applicable.  This message was generated using dictation software, and as a result, it may contain unintentional typos or errors.  Nevertheless, extensive effort was made to accurately convey at the pertinent aspects of the patient visit.  There may have been are other unrelated non-urgent complaints, but due to the busy schedule and the amount of time already spent with him, time does not permit to address these issues at today's visit. Another appointment may have or has been requested to review these additional issues.     Arvella Hummer, MD, MS      [1] No Known Allergies  "

## 2024-01-19 ENCOUNTER — Ambulatory Visit: Payer: Self-pay | Admitting: Family Medicine

## 2024-01-19 DIAGNOSIS — E782 Mixed hyperlipidemia: Secondary | ICD-10-CM

## 2024-01-19 DIAGNOSIS — K760 Fatty (change of) liver, not elsewhere classified: Secondary | ICD-10-CM

## 2024-01-19 LAB — HCV INTERPRETATION

## 2024-01-19 LAB — RPR W/REFLEX TO TREPSURE: RPR: NONREACTIVE

## 2024-01-19 LAB — HIV ANTIBODY (ROUTINE TESTING W REFLEX)
HIV 1&2 Ab, 4th Generation: NONREACTIVE
HIV FINAL INTERPRETATION: NEGATIVE

## 2024-01-19 LAB — TREPONEMAL ANTIBODIES, TPPA: Treponemal Antibodies, TPPA: NONREACTIVE

## 2024-01-19 LAB — APO A1 + B + RATIO
Apolipo. B/A-1 Ratio: 0.8 ratio — ABNORMAL HIGH (ref 0.0–0.7)
Apolipoprotein A-1: 118 mg/dL (ref 101–178)
Apolipoprotein B: 94 mg/dL — ABNORMAL HIGH

## 2024-01-19 LAB — HEPATITIS B SURFACE ANTIBODY,QUALITATIVE: Hep B S Ab: REACTIVE — AB

## 2024-01-19 LAB — HEPATITIS B CORE ANTIBODY, TOTAL: Hep B Core Total Ab: NONREACTIVE

## 2024-01-19 LAB — HEPATITIS B SURFACE ANTIGEN: Hepatitis B Surface Ag: NONREACTIVE

## 2024-01-19 LAB — HCV AB W REFLEX TO QUANT PCR: HCV Ab: NONREACTIVE

## 2024-01-19 LAB — PSA: PSA: 1.55 ng/mL

## 2024-01-19 MED ORDER — ATORVASTATIN CALCIUM 40 MG PO TABS: 40.0000 mg | ORAL_TABLET | Freq: Every day | ORAL | 3 refills | Status: AC

## 2024-01-20 LAB — URINALYSIS W MICROSCOPIC + REFLEX CULTURE
Bacteria, UA: NONE SEEN /HPF
Bilirubin Urine: NEGATIVE
Glucose, UA: NEGATIVE
Hgb urine dipstick: NEGATIVE
Ketones, ur: NEGATIVE
Nitrites, Initial: NEGATIVE
Protein, ur: NEGATIVE
RBC / HPF: NONE SEEN /HPF (ref 0–2)
Specific Gravity, Urine: 1.023 (ref 1.001–1.035)
Squamous Epithelial / HPF: NONE SEEN /HPF
WBC, UA: NONE SEEN /HPF (ref 0–5)
pH: 5.5 (ref 5.0–8.0)

## 2024-01-20 LAB — CHLAMYDIA/GONOCOCCUS/TRICHOMONAS, NAA
Chlamydia by NAA: NEGATIVE
Gonococcus by NAA: NEGATIVE
Trich vag by NAA: NEGATIVE

## 2024-01-20 LAB — URINE CULTURE
MICRO NUMBER:: 17442576
Result:: NO GROWTH
SPECIMEN QUALITY:: ADEQUATE

## 2024-01-20 LAB — CULTURE INDICATED

## 2024-01-20 NOTE — Telephone Encounter (Signed)
 Patient has viewed results on MyChart. Last read by Fairy DOROTHA Beard Joe at 3:33PM on 01/19/2024.   Curtistine Quiet, CMA

## 2024-01-24 NOTE — Progress Notes (Signed)
 PSA was added to labs.

## 2024-02-02 ENCOUNTER — Ambulatory Visit (HOSPITAL_BASED_OUTPATIENT_CLINIC_OR_DEPARTMENT_OTHER)
Admission: RE | Admit: 2024-02-02 | Discharge: 2024-02-02 | Disposition: A | Discharge status: Home / Self Care | Source: Ambulatory Visit | Attending: Family Medicine | Admitting: Family Medicine

## 2024-02-02 DIAGNOSIS — K760 Fatty (change of) liver, not elsewhere classified: Secondary | ICD-10-CM | POA: Diagnosis present

## 2024-02-04 ENCOUNTER — Ambulatory Visit: Payer: Self-pay | Admitting: Family Medicine

## 2024-02-05 ENCOUNTER — Encounter: Payer: Self-pay | Admitting: Internal Medicine

## 2024-02-06 ENCOUNTER — Telehealth: Payer: Self-pay

## 2024-02-06 ENCOUNTER — Other Ambulatory Visit (HOSPITAL_COMMUNITY): Payer: Self-pay

## 2024-02-06 NOTE — Telephone Encounter (Signed)
 Pharmacy Patient Advocate Encounter   Received notification from Pt Calls Messages that prior authorization for zepbound  is required/requested.   Insurance verification completed.   The patient is insured through [facility] and [facility].   ([employer]) Per test claim: The current 30 day co-pay is, $0.  No PA needed at this time. This test claim was processed through [facility]- copay amounts may vary at other pharmacies due to pharmacy/plan contracts, or as the patient moves through the different stages of their insurance plan.     ([facility]) Per test claim: Per test claim, medication is not covered due to plan/benefit exclusion, PA not submitted at this time

## 2024-03-20 ENCOUNTER — Ambulatory Visit

## 2024-04-20 ENCOUNTER — Ambulatory Visit

## 2024-06-06 ENCOUNTER — Ambulatory Visit: Admitting: Adult Health

## 2024-06-20 ENCOUNTER — Ambulatory Visit: Admitting: Internal Medicine
